# Patient Record
Sex: Female | Born: 1955 | Hispanic: No | Marital: Single | Smoking: Never smoker
Health system: Southern US, Community
[De-identification: ages and names within clinical notes are randomized; demographics above are authoritative.]

## PROBLEM LIST (undated history)

## (undated) DIAGNOSIS — R011 Cardiac murmur, unspecified: Secondary | ICD-10-CM

## (undated) DIAGNOSIS — R002 Palpitations: Secondary | ICD-10-CM

## (undated) DIAGNOSIS — I10 Essential (primary) hypertension: Secondary | ICD-10-CM

## (undated) DIAGNOSIS — J45909 Unspecified asthma, uncomplicated: Secondary | ICD-10-CM

## (undated) DIAGNOSIS — E7800 Pure hypercholesterolemia, unspecified: Secondary | ICD-10-CM

## (undated) DIAGNOSIS — D219 Benign neoplasm of connective and other soft tissue, unspecified: Secondary | ICD-10-CM

## (undated) DIAGNOSIS — G5600 Carpal tunnel syndrome, unspecified upper limb: Secondary | ICD-10-CM

## (undated) HISTORY — PX: TUBAL LIGATION: SHX77

---

## 1998-01-05 ENCOUNTER — Emergency Department (HOSPITAL_COMMUNITY): Admission: EM | Admit: 1998-01-05 | Discharge: 1998-01-05 | Payer: Self-pay | Admitting: Emergency Medicine

## 1998-01-18 ENCOUNTER — Ambulatory Visit (HOSPITAL_COMMUNITY): Admission: RE | Admit: 1998-01-18 | Discharge: 1998-01-18 | Payer: Self-pay | Admitting: Gastroenterology

## 1998-03-11 ENCOUNTER — Emergency Department (HOSPITAL_COMMUNITY): Admission: EM | Admit: 1998-03-11 | Discharge: 1998-03-11 | Payer: Self-pay | Admitting: Emergency Medicine

## 1998-05-23 ENCOUNTER — Emergency Department (HOSPITAL_COMMUNITY): Admission: EM | Admit: 1998-05-23 | Discharge: 1998-05-23 | Payer: Self-pay | Admitting: Emergency Medicine

## 1998-09-05 ENCOUNTER — Emergency Department (HOSPITAL_COMMUNITY): Admission: EM | Admit: 1998-09-05 | Discharge: 1998-09-05 | Payer: Self-pay | Admitting: Emergency Medicine

## 1998-09-06 ENCOUNTER — Emergency Department (HOSPITAL_COMMUNITY): Admission: EM | Admit: 1998-09-06 | Discharge: 1998-09-06 | Payer: Self-pay | Admitting: Emergency Medicine

## 1999-03-20 ENCOUNTER — Emergency Department (HOSPITAL_COMMUNITY): Admission: EM | Admit: 1999-03-20 | Discharge: 1999-03-20 | Payer: Self-pay | Admitting: Internal Medicine

## 2000-05-23 ENCOUNTER — Emergency Department (HOSPITAL_COMMUNITY): Admission: EM | Admit: 2000-05-23 | Discharge: 2000-05-23 | Payer: Self-pay | Admitting: *Deleted

## 2001-01-11 ENCOUNTER — Emergency Department (HOSPITAL_COMMUNITY): Admission: EM | Admit: 2001-01-11 | Discharge: 2001-01-12 | Payer: Self-pay | Admitting: Emergency Medicine

## 2001-01-12 ENCOUNTER — Emergency Department (HOSPITAL_COMMUNITY): Admission: EM | Admit: 2001-01-12 | Discharge: 2001-01-12 | Payer: Self-pay | Admitting: Emergency Medicine

## 2001-01-12 ENCOUNTER — Encounter: Payer: Self-pay | Admitting: Emergency Medicine

## 2001-01-14 ENCOUNTER — Encounter: Payer: Self-pay | Admitting: Emergency Medicine

## 2001-01-14 ENCOUNTER — Emergency Department (HOSPITAL_COMMUNITY): Admission: EM | Admit: 2001-01-14 | Discharge: 2001-01-14 | Payer: Self-pay | Admitting: Emergency Medicine

## 2001-02-02 ENCOUNTER — Encounter: Admission: RE | Admit: 2001-02-02 | Discharge: 2001-02-02 | Payer: Self-pay | Admitting: Urology

## 2001-02-02 ENCOUNTER — Encounter: Payer: Self-pay | Admitting: Urology

## 2001-02-05 ENCOUNTER — Encounter: Admission: RE | Admit: 2001-02-05 | Discharge: 2001-02-05 | Payer: Self-pay | Admitting: Urology

## 2001-02-05 ENCOUNTER — Encounter: Payer: Self-pay | Admitting: Urology

## 2001-05-06 ENCOUNTER — Emergency Department (HOSPITAL_COMMUNITY): Admission: EM | Admit: 2001-05-06 | Discharge: 2001-05-06 | Payer: Self-pay | Admitting: Emergency Medicine

## 2001-05-06 ENCOUNTER — Encounter: Payer: Self-pay | Admitting: Emergency Medicine

## 2001-05-18 ENCOUNTER — Encounter: Payer: Self-pay | Admitting: Cardiology

## 2001-05-18 ENCOUNTER — Encounter: Admission: RE | Admit: 2001-05-18 | Discharge: 2001-05-18 | Payer: Self-pay | Admitting: Cardiology

## 2001-07-27 ENCOUNTER — Ambulatory Visit (HOSPITAL_COMMUNITY): Admission: RE | Admit: 2001-07-27 | Discharge: 2001-07-27 | Payer: Self-pay | Admitting: Cardiology

## 2001-09-04 ENCOUNTER — Emergency Department (HOSPITAL_COMMUNITY): Admission: EM | Admit: 2001-09-04 | Discharge: 2001-09-04 | Payer: Self-pay | Admitting: Emergency Medicine

## 2001-09-04 ENCOUNTER — Encounter: Payer: Self-pay | Admitting: Emergency Medicine

## 2001-09-11 ENCOUNTER — Emergency Department (HOSPITAL_COMMUNITY): Admission: EM | Admit: 2001-09-11 | Discharge: 2001-09-11 | Payer: Self-pay | Admitting: Emergency Medicine

## 2002-01-08 ENCOUNTER — Emergency Department (HOSPITAL_COMMUNITY): Admission: EM | Admit: 2002-01-08 | Discharge: 2002-01-08 | Payer: Self-pay | Admitting: Emergency Medicine

## 2002-01-21 ENCOUNTER — Emergency Department (HOSPITAL_COMMUNITY): Admission: EM | Admit: 2002-01-21 | Discharge: 2002-01-21 | Payer: Self-pay | Admitting: Emergency Medicine

## 2002-05-08 ENCOUNTER — Emergency Department (HOSPITAL_COMMUNITY): Admission: EM | Admit: 2002-05-08 | Discharge: 2002-05-08 | Payer: Self-pay | Admitting: Emergency Medicine

## 2002-05-31 ENCOUNTER — Encounter: Payer: Self-pay | Admitting: Emergency Medicine

## 2002-05-31 ENCOUNTER — Emergency Department (HOSPITAL_COMMUNITY): Admission: EM | Admit: 2002-05-31 | Discharge: 2002-05-31 | Payer: Self-pay | Admitting: Emergency Medicine

## 2003-06-20 ENCOUNTER — Emergency Department (HOSPITAL_COMMUNITY): Admission: AD | Admit: 2003-06-20 | Discharge: 2003-06-20 | Payer: Self-pay | Admitting: Family Medicine

## 2003-11-06 ENCOUNTER — Emergency Department (HOSPITAL_COMMUNITY): Admission: EM | Admit: 2003-11-06 | Discharge: 2003-11-06 | Payer: Self-pay

## 2004-05-07 ENCOUNTER — Emergency Department (HOSPITAL_COMMUNITY): Admission: EM | Admit: 2004-05-07 | Discharge: 2004-05-07 | Payer: Self-pay | Admitting: Family Medicine

## 2004-05-13 ENCOUNTER — Ambulatory Visit (HOSPITAL_COMMUNITY): Admission: RE | Admit: 2004-05-13 | Discharge: 2004-05-13 | Payer: Self-pay | Admitting: Family Medicine

## 2004-06-03 ENCOUNTER — Emergency Department (HOSPITAL_COMMUNITY): Admission: EM | Admit: 2004-06-03 | Discharge: 2004-06-03 | Payer: Self-pay | Admitting: Family Medicine

## 2004-06-13 ENCOUNTER — Emergency Department (HOSPITAL_COMMUNITY): Admission: EM | Admit: 2004-06-13 | Discharge: 2004-06-13 | Payer: Self-pay | Admitting: Family Medicine

## 2004-08-31 ENCOUNTER — Emergency Department (HOSPITAL_COMMUNITY): Admission: EM | Admit: 2004-08-31 | Discharge: 2004-08-31 | Payer: Self-pay | Admitting: Emergency Medicine

## 2005-04-03 ENCOUNTER — Emergency Department (HOSPITAL_COMMUNITY): Admission: EM | Admit: 2005-04-03 | Discharge: 2005-04-03 | Payer: Self-pay | Admitting: Emergency Medicine

## 2006-04-30 ENCOUNTER — Emergency Department (HOSPITAL_COMMUNITY): Admission: EM | Admit: 2006-04-30 | Discharge: 2006-04-30 | Payer: Self-pay | Admitting: Emergency Medicine

## 2006-11-25 ENCOUNTER — Emergency Department (HOSPITAL_COMMUNITY): Admission: EM | Admit: 2006-11-25 | Discharge: 2006-11-25 | Payer: Self-pay | Admitting: Emergency Medicine

## 2007-07-29 ENCOUNTER — Emergency Department (HOSPITAL_COMMUNITY): Admission: EM | Admit: 2007-07-29 | Discharge: 2007-07-29 | Payer: Self-pay | Admitting: Emergency Medicine

## 2007-09-02 ENCOUNTER — Emergency Department (HOSPITAL_COMMUNITY): Admission: EM | Admit: 2007-09-02 | Discharge: 2007-09-02 | Payer: Self-pay | Admitting: Emergency Medicine

## 2008-02-25 ENCOUNTER — Emergency Department (HOSPITAL_COMMUNITY): Admission: EM | Admit: 2008-02-25 | Discharge: 2008-02-25 | Payer: Self-pay | Admitting: Emergency Medicine

## 2008-04-20 ENCOUNTER — Emergency Department (HOSPITAL_COMMUNITY): Admission: EM | Admit: 2008-04-20 | Discharge: 2008-04-20 | Payer: Self-pay | Admitting: Emergency Medicine

## 2008-12-07 ENCOUNTER — Emergency Department (HOSPITAL_COMMUNITY): Admission: EM | Admit: 2008-12-07 | Discharge: 2008-12-07 | Payer: Self-pay | Admitting: Emergency Medicine

## 2009-09-29 ENCOUNTER — Emergency Department (HOSPITAL_COMMUNITY): Admission: EM | Admit: 2009-09-29 | Discharge: 2009-09-29 | Payer: Self-pay | Admitting: Emergency Medicine

## 2010-08-03 ENCOUNTER — Encounter: Payer: Self-pay | Admitting: Family Medicine

## 2010-08-04 ENCOUNTER — Encounter: Payer: Self-pay | Admitting: Emergency Medicine

## 2010-10-22 LAB — POCT CARDIAC MARKERS
CKMB, poc: <1 ng/mL — ABNORMAL LOW (ref 1.0–8.0)
Myoglobin, poc: 90.9 ng/mL (ref 12–200)

## 2010-10-22 LAB — POCT I-STAT, CHEM 8
HCT: 35 % — ABNORMAL LOW (ref 36.0–46.0)
Hemoglobin: 11.9 g/dL — ABNORMAL LOW (ref 12.0–15.0)
Potassium: 4 mEq/L (ref 3.5–5.1)
Sodium: 140 mEq/L (ref 135–145)

## 2010-12-18 ENCOUNTER — Emergency Department (HOSPITAL_COMMUNITY)
Admission: EM | Admit: 2010-12-18 | Discharge: 2010-12-18 | Disposition: A | Discharge status: Home / Self Care | Payer: Self-pay | Attending: Emergency Medicine | Admitting: Emergency Medicine

## 2010-12-18 DIAGNOSIS — M25539 Pain in unspecified wrist: Secondary | ICD-10-CM | POA: Insufficient documentation

## 2010-12-18 DIAGNOSIS — M65839 Other synovitis and tenosynovitis, unspecified forearm: Secondary | ICD-10-CM | POA: Insufficient documentation

## 2010-12-18 DIAGNOSIS — M65849 Other synovitis and tenosynovitis, unspecified hand: Secondary | ICD-10-CM | POA: Insufficient documentation

## 2010-12-18 DIAGNOSIS — M79609 Pain in unspecified limb: Secondary | ICD-10-CM

## 2010-12-18 LAB — URINE MICROSCOPIC-ADD ON

## 2010-12-18 LAB — POCT I-STAT, CHEM 8
Calcium, Ion: 1.2 mmol/L (ref 1.12–1.32)
Glucose, Bld: 103 mg/dL — ABNORMAL HIGH (ref 70–99)
HCT: 32 % — ABNORMAL LOW (ref 36.0–46.0)
Hemoglobin: 10.9 g/dL — ABNORMAL LOW (ref 12.0–15.0)

## 2010-12-18 LAB — URINALYSIS, ROUTINE W REFLEX MICROSCOPIC
Bilirubin Urine: NEGATIVE
Ketones, ur: NEGATIVE mg/dL
Nitrite: NEGATIVE
Urobilinogen, UA: 0.2 mg/dL (ref 0.0–1.0)
pH: 5.5 (ref 5.0–8.0)

## 2011-04-04 ENCOUNTER — Emergency Department (HOSPITAL_COMMUNITY)
Admission: EM | Admit: 2011-04-04 | Discharge: 2011-04-04 | Disposition: A | Discharge status: Home / Self Care | Payer: Self-pay | Attending: Emergency Medicine | Admitting: Emergency Medicine

## 2011-04-04 DIAGNOSIS — M79609 Pain in unspecified limb: Secondary | ICD-10-CM | POA: Insufficient documentation

## 2011-04-04 DIAGNOSIS — M779 Enthesopathy, unspecified: Secondary | ICD-10-CM | POA: Insufficient documentation

## 2011-04-04 DIAGNOSIS — M7989 Other specified soft tissue disorders: Secondary | ICD-10-CM | POA: Insufficient documentation

## 2011-04-04 DIAGNOSIS — M62838 Other muscle spasm: Secondary | ICD-10-CM | POA: Insufficient documentation

## 2011-04-04 DIAGNOSIS — M25519 Pain in unspecified shoulder: Secondary | ICD-10-CM | POA: Insufficient documentation

## 2011-04-04 LAB — BASIC METABOLIC PANEL WITH GFR
BUN: 11
CO2: 27
Calcium: 9.5
Chloride: 109
Creatinine, Ser: 0.97
GFR calc Af Amer: >60

## 2011-04-04 LAB — DIFFERENTIAL
Basophils Absolute: 0.1
Basophils Relative: 1
Eosinophils Absolute: 0.1
Eosinophils Relative: 1
Lymphocytes Relative: 17
Lymphs Abs: 1.3
Monocytes Absolute: 0.3
Monocytes Relative: 4
Neutro Abs: 6
Neutrophils Relative %: 77

## 2011-04-04 LAB — POCT CARDIAC MARKERS
CKMB, poc: <1 — ABNORMAL LOW
Myoglobin, poc: 52.6
Operator id: 4708
Troponin i, poc: <0.05

## 2011-04-04 LAB — CBC
HCT: 29.9 — ABNORMAL LOW
Hemoglobin: 9.5 — ABNORMAL LOW
MCHC: 31.8
MCV: 76 — ABNORMAL LOW
Platelets: 385
RBC: 3.94
RDW: 21.2 — ABNORMAL HIGH
WBC: 7.8

## 2011-04-04 LAB — BASIC METABOLIC PANEL
GFR calc non Af Amer: >60
Glucose, Bld: 102 — ABNORMAL HIGH
Potassium: 4.2
Sodium: 141

## 2011-04-17 ENCOUNTER — Emergency Department (HOSPITAL_COMMUNITY)
Admission: EM | Admit: 2011-04-17 | Discharge: 2011-04-17 | Disposition: A | Discharge status: Home / Self Care | Payer: Self-pay | Attending: Emergency Medicine | Admitting: Emergency Medicine

## 2011-04-17 ENCOUNTER — Emergency Department (HOSPITAL_COMMUNITY): Payer: Self-pay

## 2011-04-17 DIAGNOSIS — R110 Nausea: Secondary | ICD-10-CM | POA: Insufficient documentation

## 2011-04-17 DIAGNOSIS — M25519 Pain in unspecified shoulder: Secondary | ICD-10-CM | POA: Insufficient documentation

## 2011-04-17 DIAGNOSIS — M546 Pain in thoracic spine: Secondary | ICD-10-CM | POA: Insufficient documentation

## 2011-04-17 DIAGNOSIS — R209 Unspecified disturbances of skin sensation: Secondary | ICD-10-CM | POA: Insufficient documentation

## 2011-04-17 DIAGNOSIS — R42 Dizziness and giddiness: Secondary | ICD-10-CM | POA: Insufficient documentation

## 2011-04-17 DIAGNOSIS — I498 Other specified cardiac arrhythmias: Secondary | ICD-10-CM | POA: Insufficient documentation

## 2011-04-17 DIAGNOSIS — R072 Precordial pain: Secondary | ICD-10-CM | POA: Insufficient documentation

## 2011-04-17 DIAGNOSIS — R011 Cardiac murmur, unspecified: Secondary | ICD-10-CM | POA: Insufficient documentation

## 2011-04-17 LAB — BASIC METABOLIC PANEL
BUN: 15 mg/dL (ref 6–23)
Calcium: 9.7 mg/dL (ref 8.4–10.5)
GFR calc Af Amer: 80 mL/min — ABNORMAL LOW (ref >90)
GFR calc non Af Amer: 69 mL/min — ABNORMAL LOW (ref >90)
Potassium: 4.4 mEq/L (ref 3.5–5.1)
Sodium: 136 mEq/L (ref 135–145)

## 2011-04-17 LAB — DIFFERENTIAL
Basophils Relative: 0 % (ref 0–1)
Eosinophils Absolute: 0 10*3/uL (ref 0.0–0.7)
Eosinophils Relative: 0 % (ref 0–5)
Neutrophils Relative %: 87 % — ABNORMAL HIGH (ref 43–77)

## 2011-04-17 LAB — CBC
Platelets: 209 10*3/uL (ref 150–400)
RBC: 4.81 MIL/uL (ref 3.87–5.11)
RDW: 17.4 % — ABNORMAL HIGH (ref 11.5–15.5)
WBC: 11.5 10*3/uL — ABNORMAL HIGH (ref 4.0–10.5)

## 2011-04-17 LAB — POCT I-STAT TROPONIN I: Troponin i, poc: 0 ng/mL (ref 0.00–0.08)

## 2012-01-29 ENCOUNTER — Emergency Department (HOSPITAL_COMMUNITY)
Admission: EM | Admit: 2012-01-29 | Discharge: 2012-01-29 | Disposition: A | Discharge status: Home / Self Care | Payer: Self-pay | Attending: Emergency Medicine | Admitting: Emergency Medicine

## 2012-01-29 ENCOUNTER — Encounter (HOSPITAL_COMMUNITY): Payer: Self-pay | Admitting: Emergency Medicine

## 2012-01-29 DIAGNOSIS — X58XXXA Exposure to other specified factors, initial encounter: Secondary | ICD-10-CM | POA: Insufficient documentation

## 2012-01-29 DIAGNOSIS — S025XXA Fracture of tooth (traumatic), initial encounter for closed fracture: Secondary | ICD-10-CM | POA: Insufficient documentation

## 2012-01-29 DIAGNOSIS — K0889 Other specified disorders of teeth and supporting structures: Secondary | ICD-10-CM

## 2012-01-29 HISTORY — DX: Palpitations: R00.2

## 2012-01-29 HISTORY — DX: Cardiac murmur, unspecified: R01.1

## 2012-01-29 MED ORDER — HYDROCODONE-ACETAMINOPHEN 5-325 MG PO TABS
1.0000 | ORAL_TABLET | Freq: Once | ORAL | Status: AC
  Administered 2012-01-29: 1 via ORAL
  Filled 2012-01-29: qty 1

## 2012-01-29 MED ORDER — HYDROCODONE-ACETAMINOPHEN 5-325 MG PO TABS: ORAL_TABLET | ORAL | Status: DC

## 2012-01-29 MED ORDER — IBUPROFEN 800 MG PO TABS: 800.0000 mg | ORAL_TABLET | Freq: Three times a day (TID) | ORAL | Status: DC | PRN

## 2012-01-29 NOTE — ED Provider Notes (Signed)
History     CSN: [id number]  Arrival date & time 01/29/12  1128   First MD Initiated Contact with Patient 01/29/12 1142      Chief Complaint  Patient presents with  . Dental Pain    (Consider location/radiation/quality/duration/timing/severity/associated sxs/prior treatment) HPI Comments: Patient presents with 3 days of dental pain on her left lower jaw. Patient states this began acutely after breaking her tooth on a kernel of popcorn. Patient has used ibuprofen for pain without relief and states this is making her stomach upset. She denies facial or neck swelling. She denies trouble breathing or fever. Patient denies head injury. Nothing makes symptoms better or worse. Onset was acute. Course is constant. Pain radiates to the patient's left ear.  Patient is a 56 y.o. female presenting with tooth pain. The history is provided by the patient.  Dental PainPrimary symptoms do not include mouth pain, headaches, fever, shortness of breath or sore throat. The symptoms began 3 to 5 days ago. The symptoms are unchanged. The symptoms are new.  Additional symptoms include: gum tenderness. Additional symptoms do not include: gum swelling, facial swelling, trouble swallowing and ear pain.    Past Medical History  Diagnosis Date  . Heart palpitations   . Heart murmur     Past Surgical History  Procedure Date  . Tubal ligation     No family history on file.  History  Substance Use Topics  . Smoking status: Never Smoker   . Smokeless tobacco: Not on file  . Alcohol Use: No    OB History    Grav Para Term Preterm Abortions TAB SAB Ect Mult Living                  Review of Systems  Constitutional: Negative for fever.  HENT: Positive for dental problem. Negative for ear pain, sore throat, facial swelling, trouble swallowing and neck pain.   Respiratory: Negative for shortness of breath and stridor.   Skin: Negative for color change.  Neurological: Negative for headaches.     Allergies  Codeine  Home Medications  No current outpatient prescriptions on file.  BP 139/88  Pulse 96  Temp 97.8 F (36.6 C) (Oral)  Resp 16  SpO2 99%  Physical Exam  Nursing note and vitals reviewed. Constitutional: She is oriented to person, place, and time. She appears well-developed and well-nourished.  HENT:  Head: Normocephalic and atraumatic. No trismus in the jaw.  Right Ear: Tympanic membrane, external ear and ear canal normal.  Left Ear: Tympanic membrane, external ear and ear canal normal.  Nose: Nose normal.  Mouth/Throat: Uvula is midline, oropharynx is clear and moist and mucous membranes are normal. Abnormal dentition. Dental caries present. No dental abscesses or uvula swelling. No tonsillar abscesses.         No swelling or erythema noted on exam.  Eyes: Conjunctivae are normal.  Neck: Normal range of motion. Neck supple.       No neck swelling or Ludwig's angina  Lymphadenopathy:    She has no cervical adenopathy.  Neurological: She is alert and oriented to person, place, and time.  Skin: Skin is warm and dry.  Psychiatric: She has a normal mood and affect.    ED Course  Procedures (including critical care time)  Labs Reviewed - No data to display No results found.   1. Pain, dental     12:04 PM Patient seen and examined. Medications ordered.   Vital signs reviewed and are as follows:  Filed Vitals:   01/29/12 1140  BP: 139/88  Pulse: 96  Temp: 97.8 F (36.6 C)  Resp: 16   Patient counseled to take prescribed medications as directed, return with worsening facial or neck swelling, and to follow-up with his dentist as soon as possible.   Patient counseled on use of narcotic pain medications. Counseled not to combine these medications with others containing tylenol. Urged not to drink alcohol, drive, or perform any other activities that requires focus while taking these medications. The patient verbalizes understanding and agrees with  the plan.   MDM  Patient with toothache. No abscess. Exam unconcerning for Ludwig's angina or other deep tissue infection in neck.  Will treat with pain medicine/NSAIDs.  Urged patient to follow-up with dentist.           Renne Crigler, PA 01/29/12 1308

## 2012-01-29 NOTE — ED Provider Notes (Signed)
Medical screening examination/treatment/procedure(s) were performed by non-physician practitioner and as supervising physician I was immediately available for consultation/collaboration.   Lyanne Co, MD 01/29/12 [phone]

## 2012-01-29 NOTE — ED Notes (Signed)
Toothache, started 3 days ago, was taking ibuprofen but "making stomach hurt"

## 2012-01-29 NOTE — ED Notes (Signed)
Pt presenting to ed with c/o lower left side toothache pain x 3 days.

## 2012-02-06 ENCOUNTER — Emergency Department (HOSPITAL_COMMUNITY)
Admission: EM | Admit: 2012-02-06 | Discharge: 2012-02-06 | Disposition: A | Discharge status: Home / Self Care | Payer: Self-pay | Attending: Emergency Medicine | Admitting: Emergency Medicine

## 2012-02-06 ENCOUNTER — Encounter (HOSPITAL_COMMUNITY): Payer: Self-pay | Admitting: *Deleted

## 2012-02-06 DIAGNOSIS — K029 Dental caries, unspecified: Secondary | ICD-10-CM | POA: Insufficient documentation

## 2012-02-06 DIAGNOSIS — H538 Other visual disturbances: Secondary | ICD-10-CM | POA: Insufficient documentation

## 2012-02-06 DIAGNOSIS — S025XXA Fracture of tooth (traumatic), initial encounter for closed fracture: Secondary | ICD-10-CM | POA: Insufficient documentation

## 2012-02-06 DIAGNOSIS — K0889 Other specified disorders of teeth and supporting structures: Secondary | ICD-10-CM

## 2012-02-06 DIAGNOSIS — X58XXXA Exposure to other specified factors, initial encounter: Secondary | ICD-10-CM | POA: Insufficient documentation

## 2012-02-06 MED ORDER — PENICILLIN V POTASSIUM 500 MG PO TABS: 500.0000 mg | ORAL_TABLET | Freq: Three times a day (TID) | ORAL | Status: AC

## 2012-02-06 MED ORDER — HYDROCODONE-ACETAMINOPHEN 5-325 MG PO TABS: ORAL_TABLET | ORAL | Status: AC

## 2012-02-06 MED ORDER — IBUPROFEN 800 MG PO TABS: 800.0000 mg | ORAL_TABLET | Freq: Three times a day (TID) | ORAL | Status: AC | PRN

## 2012-02-06 NOTE — ED Provider Notes (Signed)
History     CSN: [id number]  Arrival date & time 02/06/12  1191   First MD Initiated Contact with Patient 01/29/12 1142      Chief Complaint  Patient presents with  . Dental Pain    (Consider location/radiation/quality/duration/timing/severity/associated sxs/prior treatment) HPI Comments: Patient presents with 1-2 weeks of dental pain on her left lower jaw. Patient states this began acutely at that time after breaking her tooth on a kernel of popcorn. Patient has used ibuprofen for pain without relief. She was seen in ED by myself a week ago and rx hydrocodone. She states she has developed mild right facial swelling. She denies trouble breathing or fever. She states she has some mild blurry vision in L eye without weakness in extremities. She has upcoming opthalmology and PCP appointments. Patient states she has dental appt in 3 days. Patient denies head injury. Nothing makes symptoms better or worse. Onset was acute. Course is constant. Pain radiates to the patient's left ear.  Dental PainPrimary symptoms do not include mouth pain, headaches, fever, shortness of breath or sore throat. The symptoms began more than 1 week ago. The symptoms are unchanged. The symptoms are new.  Additional symptoms include: gum tenderness and facial swelling. Additional symptoms do not include: gum swelling, trouble swallowing and ear pain.   Patient is a 56 y.o. female presenting with tooth pain. The history is provided by the patient.    Past Medical History  Diagnosis Date  . Heart palpitations   . Heart murmur     Past Surgical History  Procedure Date  . Tubal ligation     No family history on file.  History  Substance Use Topics  . Smoking status: Never Smoker   . Smokeless tobacco: Not on file  . Alcohol Use: No    OB History    Grav Para Term Preterm Abortions TAB SAB Ect Mult Living                  Review of Systems  Constitutional: Negative for fever.  HENT: Positive for  facial swelling and dental problem. Negative for ear pain, sore throat, trouble swallowing and neck pain.   Eyes: Positive for visual disturbance.  Respiratory: Negative for shortness of breath and stridor.   Skin: Negative for color change.  Neurological: Negative for weakness and headaches.    Allergies  Codeine  Home Medications   Current Outpatient Rx  Name Route Sig Dispense Refill  . HYDROCODONE-ACETAMINOPHEN 5-325 MG PO TABS  Take 1-2 tablets every 6 hours as needed for severe pain 10 tablet 0  . IBUPROFEN 800 MG PO TABS Oral Take 1 tablet (800 mg total) by mouth every 8 (eight) hours as needed for pain. 15 tablet 0  . PENICILLIN V POTASSIUM 500 MG PO TABS Oral Take 1 tablet (500 mg total) by mouth 3 (three) times daily. 21 tablet 0    BP 157/89  Pulse 58  Temp 98.4 F (36.9 C) (Oral)  Resp 17  Wt 157 lb (71.215 kg)  SpO2 99%  Physical Exam  Nursing note and vitals reviewed. Constitutional: She appears well-developed and well-nourished.  HENT:  Head: Normocephalic and atraumatic. No trismus in the jaw.  Right Ear: Tympanic membrane, external ear and ear canal normal.  Left Ear: Tympanic membrane, external ear and ear canal normal.  Nose: Nose normal.  Mouth/Throat: Uvula is midline, oropharynx is clear and moist and mucous membranes are normal. Abnormal dentition. Dental caries present. No dental abscesses or  uvula swelling. No tonsillar abscesses.         No swelling or erythema noted on exam.  Eyes: Conjunctivae are normal.  Neck: Normal range of motion. Neck supple.       No neck swelling or Ludwig's angina  Lymphadenopathy:    She has no cervical adenopathy.  Neurological: She is alert. She has normal strength.  Skin: Skin is warm and dry.  Psychiatric: She has a normal mood and affect.    ED Course  Procedures  (including critical care time)  Labs Reviewed - No data to display No results found.   1. Pain, dental     9:47 AM Patient seen and  examined. Medications ordered.   Vital signs reviewed and are as follows: Filed Vitals:   02/06/12 0922  BP: 157/89  Pulse: 58  Temp: 98.4 F (36.9 C)  Resp: 17   Patient counseled to take prescribed medications as directed, return with worsening facial or neck swelling, and to follow-up with his dentist as soon as possible.   Patient counseled on use of narcotic pain medications. Counseled not to combine these medications with others containing tylenol. Urged not to drink alcohol, drive, or perform any other activities that requires focus while taking these medications. The patient verbalizes understanding and agrees with the plan.   MDM  Patient with toothache. No abscess. Exam unconcerning for Ludwig's angina or other deep tissue infection in neck.  Will treat with pain medicine/NSAIDs/penicillin.  Urged patient to follow-up with dentist.    Unclear why patient is reporting blurry vision in L eye. It is mild. She does not have any other neurological findings, CN II-XII intact, strength normal. She has follow-up planned with PCP and ophtho.       [place], [place] 02/06/12 [phone]

## 2012-02-06 NOTE — ED Notes (Signed)
Pt states "I tried to get an appt today but as you know most dentists are closed on Friday, my dentist can see me on Monday, it's a cracked wisdom tooth"; pt c/o left lower back tooth pain.

## 2012-02-07 NOTE — ED Provider Notes (Signed)
Medical screening examination/treatment/procedure(s) were performed by non-physician practitioner and as supervising physician I was immediately available for consultation/collaboration.  Lonni Dirden, MD 02/07/12 1113 

## 2012-03-22 ENCOUNTER — Emergency Department (HOSPITAL_COMMUNITY)
Admission: EM | Admit: 2012-03-22 | Discharge: 2012-03-22 | Disposition: A | Discharge status: Home / Self Care | Payer: Self-pay | Attending: Emergency Medicine | Admitting: Emergency Medicine

## 2012-03-22 ENCOUNTER — Encounter (HOSPITAL_COMMUNITY): Payer: Self-pay | Admitting: Emergency Medicine

## 2012-03-22 DIAGNOSIS — Z8249 Family history of ischemic heart disease and other diseases of the circulatory system: Secondary | ICD-10-CM | POA: Insufficient documentation

## 2012-03-22 DIAGNOSIS — Z885 Allergy status to narcotic agent status: Secondary | ICD-10-CM | POA: Insufficient documentation

## 2012-03-22 DIAGNOSIS — K0889 Other specified disorders of teeth and supporting structures: Secondary | ICD-10-CM

## 2012-03-22 DIAGNOSIS — Z833 Family history of diabetes mellitus: Secondary | ICD-10-CM | POA: Insufficient documentation

## 2012-03-22 DIAGNOSIS — R079 Chest pain, unspecified: Secondary | ICD-10-CM | POA: Insufficient documentation

## 2012-03-22 DIAGNOSIS — K089 Disorder of teeth and supporting structures, unspecified: Secondary | ICD-10-CM | POA: Insufficient documentation

## 2012-03-22 MED ORDER — KETOROLAC TROMETHAMINE 10 MG PO TABS: 10.0000 mg | ORAL_TABLET | Freq: Four times a day (QID) | ORAL | Status: AC | PRN

## 2012-03-22 MED ORDER — AMOXICILLIN-POT CLAVULANATE 875-125 MG PO TABS: 1.0000 | ORAL_TABLET | Freq: Two times a day (BID) | ORAL | Status: AC

## 2012-03-22 NOTE — ED Notes (Signed)
Pt reports hx of angina, chest pain and shortness of breath. No cardiac symptoms or chest pain today. Current c/o dental pain. Dentist can not see her until Friday

## 2012-03-22 NOTE — ED Provider Notes (Signed)
Date: 03/22/2012  Rate: 69  Rhythm: normal sinus rhythm  QRS Axis: normal  Intervals: normal  ST/T Wave abnormalities: normal  Conduction Disutrbances:none  Narrative Interpretation:   Old EKG Reviewed: changes noted from 20/10/2010 HR was 116  Medical screening examination/treatment/procedure(s) were performed by non-physician practitioner and as supervising physician I was immediately available for consultation/collaboration.   Devoria Albe, MD, Armando Gang     Ward Givens, MD 03/22/12 [phone]

## 2012-03-22 NOTE — ED Provider Notes (Signed)
History     CSN: [id number]  Arrival date & time 03/22/12  4098   First MD Initiated Contact with Patient 03/22/12 1013      Chief Complaint  Patient presents with  . Chest Pain    2 day hx of chest pain. Angina x 1 week. Denies chest pain, denies shortness of breath at present  . Jaw Pain    r/jaw upper and lower jaw x 3 days    (Consider location/radiation/quality/duration/timing/severity/associated sxs/prior treatment) HPI Comments: Patient presents with jaw pain for the past 3 days. The jaw pain is located in the right upper and lower jaw, does not radiate, and is described at throbbing and severe. She has taken ibuprofen for the pain which makes her sick and provides minimal pain relief. She also reports a history of chest pain and shortness of breath that is not currently present. She denies headache, visual changes, difficulty swallowing or breathing.   Patient is a 56 y.o. female presenting with chest pain.  Chest Pain     Past Medical History  Diagnosis Date  . Heart palpitations   . Heart murmur     Past Surgical History  Procedure Date  . Tubal ligation     Family History  Problem Relation Age of Onset  . Diabetes Mother   . Hypertension Mother     History  Substance Use Topics  . Smoking status: Never Smoker   . Smokeless tobacco: Not on file  . Alcohol Use: No    OB History    Grav Para Term Preterm Abortions TAB SAB Ect Mult Living                  Review of Systems  HENT: Positive for dental problem.   Cardiovascular: Positive for chest pain.  All other systems reviewed and are negative.    Allergies  Codeine  Home Medications   Current Outpatient Rx  Name Route Sig Dispense Refill  . IBUPROFEN 200 MG PO TABS Oral Take 200 mg by mouth every 6 (six) hours as needed.      BP 134/87  Pulse 76  Temp 97.9 F (36.6 C) (Oral)  SpO2 97%  LMP 03/21/2012  Physical Exam  Nursing note and vitals reviewed. Constitutional: She is  oriented to person, place, and time. She appears well-developed and well-nourished. No distress.  HENT:  Head: Normocephalic and atraumatic.  Mouth/Throat: Oropharynx is clear and moist. No oropharyngeal exudate.         Poor dentition. Multiple rotted teeth (noted in black) and broken teeth (noted in red). No ulceration or sores noted. Upper and lower jaw teeth tender to percussion.   Eyes: Conjunctivae are normal. No scleral icterus.  Neck: Normal range of motion. Neck supple.  Cardiovascular: Normal rate and regular rhythm.  Exam reveals no gallop and no friction rub.   No murmur heard. Pulmonary/Chest: Effort normal and breath sounds normal. No respiratory distress. She has no wheezes. She has no rales. She exhibits no tenderness.  Musculoskeletal: Normal range of motion.  Lymphadenopathy:    She has no cervical adenopathy.  Neurological: She is alert and oriented to person, place, and time.  Skin: Skin is warm and dry. She is not diaphoretic.  Psychiatric: She has a normal mood and affect. Her behavior is normal.    ED Course  Procedures (including critical care time)  Labs Reviewed - No data to display No results found.   No diagnosis found.    MDM  10:45 AM Patient currently has dental pain. She has a dentist appointment on Friday. I will give her pain medication to last until Friday. I recommended a follow up with her cardiologist for chest pain and shortness of breath. She is not currently experiencing cardiac symptoms. Her EKG shows no abnormalities. No further workup is warranted at this time.         Emilia Beck, PA-C 03/22/12 1641

## 2012-03-23 NOTE — ED Provider Notes (Signed)
Medical screening examination/treatment/procedure(s) were performed by non-physician practitioner and as supervising physician I was immediately available for consultation/collaboration. Devoria Albe, MD, FACEP   Ward Givens, MD 03/23/12 [phone]

## 2013-01-28 ENCOUNTER — Encounter (HOSPITAL_COMMUNITY): Payer: Self-pay | Admitting: *Deleted

## 2013-01-28 ENCOUNTER — Emergency Department (HOSPITAL_COMMUNITY): Payer: BC Managed Care – PPO

## 2013-01-28 ENCOUNTER — Emergency Department (HOSPITAL_COMMUNITY)
Admission: EM | Admit: 2013-01-28 | Discharge: 2013-01-28 | Disposition: A | Discharge status: Home / Self Care | Payer: BC Managed Care – PPO | Attending: Emergency Medicine | Admitting: Emergency Medicine

## 2013-01-28 DIAGNOSIS — R1110 Vomiting, unspecified: Secondary | ICD-10-CM | POA: Insufficient documentation

## 2013-01-28 DIAGNOSIS — T465X5A Adverse effect of other antihypertensive drugs, initial encounter: Secondary | ICD-10-CM | POA: Insufficient documentation

## 2013-01-28 DIAGNOSIS — R509 Fever, unspecified: Secondary | ICD-10-CM | POA: Insufficient documentation

## 2013-01-28 DIAGNOSIS — Z79899 Other long term (current) drug therapy: Secondary | ICD-10-CM | POA: Insufficient documentation

## 2013-01-28 DIAGNOSIS — J3489 Other specified disorders of nose and nasal sinuses: Secondary | ICD-10-CM | POA: Insufficient documentation

## 2013-01-28 DIAGNOSIS — R011 Cardiac murmur, unspecified: Secondary | ICD-10-CM | POA: Insufficient documentation

## 2013-01-28 DIAGNOSIS — I10 Essential (primary) hypertension: Secondary | ICD-10-CM | POA: Insufficient documentation

## 2013-01-28 DIAGNOSIS — R059 Cough, unspecified: Secondary | ICD-10-CM | POA: Insufficient documentation

## 2013-01-28 DIAGNOSIS — J392 Other diseases of pharynx: Secondary | ICD-10-CM | POA: Insufficient documentation

## 2013-01-28 DIAGNOSIS — R5381 Other malaise: Secondary | ICD-10-CM | POA: Insufficient documentation

## 2013-01-28 DIAGNOSIS — J029 Acute pharyngitis, unspecified: Secondary | ICD-10-CM | POA: Insufficient documentation

## 2013-01-28 DIAGNOSIS — G44209 Tension-type headache, unspecified, not intractable: Secondary | ICD-10-CM | POA: Insufficient documentation

## 2013-01-28 DIAGNOSIS — R05 Cough: Secondary | ICD-10-CM

## 2013-01-28 HISTORY — DX: Essential (primary) hypertension: I10

## 2013-01-28 MED ORDER — IBUPROFEN 200 MG PO TABS
600.0000 mg | ORAL_TABLET | Freq: Once | ORAL | Status: AC
  Administered 2013-01-28: 600 mg via ORAL
  Filled 2013-01-28: qty 3

## 2013-01-28 MED ORDER — IBUPROFEN 600 MG PO TABS: 600.0000 mg | ORAL_TABLET | Freq: Four times a day (QID) | ORAL | Status: DC | PRN

## 2013-01-28 NOTE — ED Provider Notes (Signed)
History    CSN: [id number] Arrival date & time 01/28/13  1148  First MD Initiated Contact with Patient 01/28/13 1158     Chief Complaint  Patient presents with  . Headache  . Cough   (Consider location/radiation/quality/duration/timing/severity/associated sxs/prior Treatment) HPI Pt states she has had a month of coughing after starting Lisinopril for her BP. Stopped taking it 3 days ago and was started on HCTZ. +subjective fever. +post tussive emesis and dry throat. Pt states she has been under a lot of stress lately and has gradual onset band like HA for several days worse when coughing. No photophobia, nausea, visual changes. States she has not been taking any thing at home for pain. Pt has pain when swallowing attributed to persistent coughing but she is able to swallow. Tolerating secretions.  Past Medical History  Diagnosis Date  . Heart palpitations   . Heart murmur   . Hypertension    Past Surgical History  Procedure Laterality Date  . Tubal ligation     Family History  Problem Relation Age of Onset  . Diabetes Mother   . Hypertension Mother    History  Substance Use Topics  . Smoking status: Never Smoker   . Smokeless tobacco: Not on file  . Alcohol Use: No   OB History   Grav Para Term Preterm Abortions TAB SAB Ect Mult Living                 Review of Systems  Constitutional: Positive for fever and fatigue. Negative for chills.  HENT: Positive for congestion, sore throat and rhinorrhea. Negative for neck pain, neck stiffness and sinus pressure.   Eyes: Negative for photophobia and visual disturbance.  Respiratory: Positive for cough. Negative for shortness of breath and wheezing.   Cardiovascular: Negative for chest pain.  Gastrointestinal: Negative for nausea, vomiting and abdominal pain.  Musculoskeletal: Negative for myalgias and back pain.  Skin: Negative for rash and wound.  Neurological: Positive for headaches. Negative for dizziness, syncope,  weakness, light-headedness and numbness.  All other systems reviewed and are negative.    Allergies  Codeine  Home Medications   Current Outpatient Rx  Name  Route  Sig  Dispense  Refill  . cyclobenzaprine (FLEXERIL) 10 MG tablet   Oral   Take 10 mg by mouth at bedtime as needed for muscle spasms.         . hydrochlorothiazide (HYDRODIURIL) 25 MG tablet   Oral   Take 25 mg by mouth daily.         . Multiple Vitamin (MULTIVITAMIN WITH MINERALS) TABS   Oral   Take 1 tablet by mouth daily.         Marland Kitchen omega-3 acid ethyl esters (LOVAZA) 1 G capsule   Oral   Take 1 g by mouth daily.         . vitamin B-12 (CYANOCOBALAMIN) 1000 MCG tablet   Oral   Take 1,000 mcg by mouth daily.         Marland Kitchen ibuprofen (ADVIL,MOTRIN) 600 MG tablet   Oral   Take 1 tablet (600 mg total) by mouth every 6 (six) hours as needed for pain.   30 tablet   0    BP 114/79  Pulse 66  Temp(Src) 97.6 F (36.4 C) (Oral)  Resp 16  SpO2 100%  LMP 12/29/2012 Physical Exam  Nursing note and vitals reviewed. Constitutional: She is oriented to person, place, and time. She appears well-developed and well-nourished. No distress.  Reading a book when walking in room  HENT:  Head: Normocephalic and atraumatic.  Mouth/Throat: Oropharynx is clear and moist. No oropharyngeal exudate.  Mild TTP over bl temporalis muscle, bl occipital muscle and bl cervical paraspinal muscles.   Eyes: EOM are normal. Pupils are equal, round, and reactive to light.  Neck: Normal range of motion. Neck supple.  No meningismus   Cardiovascular: Normal rate and regular rhythm.   Pulmonary/Chest: Effort normal and breath sounds normal. No respiratory distress. She has no wheezes. She has no rales. She exhibits no tenderness.  Abdominal: Soft. Bowel sounds are normal. She exhibits no distension and no mass. There is no tenderness. There is no rebound and no guarding.  Musculoskeletal: Normal range of motion. She exhibits no edema  and no tenderness.  Neurological: She is alert and oriented to person, place, and time.  5/27motor in all ext, sensation intact  Skin: Skin is warm and dry. No rash noted. No erythema.  Psychiatric: She has a normal mood and affect. Her behavior is normal.    ED Course  Procedures (including critical care time) Labs Reviewed - No data to display Dg Chest 2 View  01/28/2013   *RADIOLOGY REPORT*  Clinical Data: Cough, chest pain  CHEST - 2 VIEW  Findings: The lungs are well-aerated and free from pulmonary edema, focal airspace consolidation or pulmonary nodule.  Cardiac and mediastinal contours are within normal limits.  No pneumothorax, or pleural effusion. No acute osseous findings.  IMPRESSION:  No acute cardiopulmonary disease.   Original Report Authenticated By: Malachy Moan, M.D.   1. Cough due to ACE inhibitor   2. Tension headache     MDM  Suspect cough caused by lisinopril. Will get cxr to r/o pneumonia given subjective fever. Pt HA is consistent with tension HA likely due to stress and persistent cough. Will treat with ibuprofen. Pt advised to take lozenge for sore, dry throat Neg CXR. Return precautions given  Loren Racer, MD 01/28/13 1406

## 2013-01-28 NOTE — ED Notes (Signed)
Pt reports taking lisinopril last month, started to develop cough and difficulty swallowing. Reports onset of headache Monday

## 2013-08-04 ENCOUNTER — Emergency Department (HOSPITAL_COMMUNITY)
Admission: EM | Admit: 2013-08-04 | Discharge: 2013-08-04 | Disposition: A | Discharge status: Home / Self Care | Payer: BC Managed Care – PPO | Attending: Emergency Medicine | Admitting: Emergency Medicine

## 2013-08-04 ENCOUNTER — Encounter (HOSPITAL_COMMUNITY): Payer: Self-pay | Admitting: Emergency Medicine

## 2013-08-04 DIAGNOSIS — R5381 Other malaise: Secondary | ICD-10-CM | POA: Insufficient documentation

## 2013-08-04 DIAGNOSIS — R011 Cardiac murmur, unspecified: Secondary | ICD-10-CM | POA: Insufficient documentation

## 2013-08-04 DIAGNOSIS — R5383 Other fatigue: Secondary | ICD-10-CM

## 2013-08-04 DIAGNOSIS — J069 Acute upper respiratory infection, unspecified: Secondary | ICD-10-CM | POA: Insufficient documentation

## 2013-08-04 DIAGNOSIS — R110 Nausea: Secondary | ICD-10-CM | POA: Insufficient documentation

## 2013-08-04 DIAGNOSIS — I10 Essential (primary) hypertension: Secondary | ICD-10-CM | POA: Insufficient documentation

## 2013-08-04 DIAGNOSIS — Z79899 Other long term (current) drug therapy: Secondary | ICD-10-CM | POA: Insufficient documentation

## 2013-08-04 DIAGNOSIS — R51 Headache: Secondary | ICD-10-CM | POA: Insufficient documentation

## 2013-08-04 DIAGNOSIS — IMO0001 Reserved for inherently not codable concepts without codable children: Secondary | ICD-10-CM | POA: Insufficient documentation

## 2013-08-04 LAB — RAPID STREP SCREEN (MED CTR MEBANE ONLY): STREPTOCOCCUS, GROUP A SCREEN (DIRECT): NEGATIVE

## 2013-08-04 MED ORDER — BENZONATATE 100 MG PO CAPS: 100.0000 mg | ORAL_CAPSULE | Freq: Three times a day (TID) | ORAL | Status: DC

## 2013-08-04 MED ORDER — GUAIFENESIN 100 MG/5ML PO LIQD: 100.0000 mg | ORAL | Status: DC | PRN

## 2013-08-04 NOTE — ED Notes (Signed)
Pt started 3 days ago with sore throat , bodyaches, chills. Did not take flu shot. States took [employer] last pm. States takes 2 BP pills so is careful about taking cold meds.

## 2013-08-04 NOTE — ED Provider Notes (Signed)
  Medical screening examination/treatment/procedure(s) were performed by non-physician practitioner and as supervising physician I was immediately available for consultation/collaboration.      Carmin Muskrat, MD 08/04/13 1052

## 2013-08-04 NOTE — ED Provider Notes (Signed)
CSN: [id number]     Arrival date & time 08/04/13  9323 History  This chart was scribed for non-physician practitioner working with Carmin Muskrat, MD by Stacy Gardner, ED scribe. This patient was seen in room TR06C/TR06C and the patient's care was started at 9:49 AM.  None    Chief Complaint  Patient presents with  . URI   (Consider location/radiation/quality/duration/timing/severity/associated sxs/prior Treatment) Patient is a 58 y.o. female presenting with URI. The history is provided by the patient and medical records. No language interpreter was used.  URI Presenting symptoms: congestion, fatigue, rhinorrhea and sore throat   Presenting symptoms: no ear pain and no fever   Associated symptoms: headaches and myalgias    HPI Comments: Brenda Peters is a 58 y.o. female who presents to the Emergency Department complaining of URI symptoms for 3 days with gradual worsening. Pt has the associated symptoms of headache, body aches, congestion, and sore throat. Denies fever. Pt states a coworker was sick recently with similar symptoms, and that she works in [employer], so is concerned about communicability.  She states she has had mild nausea secondary to post nasal drip, but denies vomiting. Pt also complains of mild chest discomfort, but only when she coughs and states it feels nothing like any chest discomfort she has had associated with her cardiac history. She has had loose stools in the past several days, but had a normal BM this morning.  She denies any recent travel and did not receive the flu shot this year.  Past Medical History  Diagnosis Date  . Heart palpitations   . Heart murmur   . Hypertension    Past Surgical History  Procedure Laterality Date  . Tubal ligation     Family History  Problem Relation Age of Onset  . Diabetes Mother   . Hypertension Mother    History  Substance Use Topics  . Smoking status: Never Smoker   . Smokeless tobacco: Not on file  .  Alcohol Use: No   OB History   Grav Para Term Preterm Abortions TAB SAB Ect Mult Living                 Review of Systems  Constitutional: Positive for chills, appetite change and fatigue. Negative for fever and diaphoresis.  HENT: Positive for congestion, postnasal drip, rhinorrhea, sinus pressure and sore throat. Negative for ear pain, hearing loss and trouble swallowing.   Gastrointestinal: Positive for nausea. Negative for vomiting, abdominal pain and diarrhea.  Genitourinary:       Pt states she is post-menopausal  Musculoskeletal: Positive for myalgias.  Neurological: Positive for headaches. Negative for weakness, light-headedness and numbness.  All other systems reviewed and are negative.    Allergies  Codeine  Home Medications   Current Outpatient Rx  Name  Route  Sig  Dispense  Refill  . cyclobenzaprine (FLEXERIL) 10 MG tablet   Oral   Take 10 mg by mouth at bedtime as needed for muscle spasms.         . hydrochlorothiazide (HYDRODIURIL) 25 MG tablet   Oral   Take 25 mg by mouth daily.         Marland Kitchen ibuprofen (ADVIL,MOTRIN) 600 MG tablet   Oral   Take 1 tablet (600 mg total) by mouth every 6 (six) hours as needed for pain.   30 tablet   0   . Multiple Vitamin (MULTIVITAMIN WITH MINERALS) TABS   Oral   Take 1 tablet by mouth  daily.         . omega-3 acid ethyl esters (LOVAZA) 1 G capsule   Oral   Take 1 g by mouth daily.         . vitamin B-12 (CYANOCOBALAMIN) 1000 MCG tablet   Oral   Take 1,000 mcg by mouth daily.          BP 130/87  Pulse 88  Temp(Src) 98.5 F (36.9 C) (Oral)  Resp 18  Wt 160 lb 1 oz (72.604 kg)  SpO2 96%  LMP 12/29/2012 Physical Exam  Nursing note and vitals reviewed. Constitutional: She is oriented to person, place, and time. She appears well-developed and well-nourished. No distress.  HENT:  Head: Normocephalic and atraumatic.  Mouth/Throat: Oropharynx is clear and moist.  Eyes: Conjunctivae are normal. Pupils are  equal, round, and reactive to light. No scleral icterus.  Neck: Neck supple.  Mild cervical lymphadenopathy with tenderness  Cardiovascular: Normal rate, regular rhythm, normal heart sounds and intact distal pulses.   No murmur heard. Pulmonary/Chest: Effort normal and breath sounds normal. No stridor. No respiratory distress. She has no wheezes. She has no rales. She exhibits tenderness.  Pt reports mild tenderness to lower sternum.  States "feels like it's from coughing"  Abdominal: Soft. Bowel sounds are normal. She exhibits no distension and no mass. There is tenderness. There is no guarding.    Pt report mild tenderness to mid lower quadrants.  Musculoskeletal: Normal range of motion.  Lymphadenopathy:    She has cervical adenopathy.  Neurological: She is alert and oriented to person, place, and time.  Skin: Skin is warm and dry. No rash noted.  Psychiatric: She has a normal mood and affect. Her behavior is normal.    ED Course  Procedures (including critical care time) DIAGNOSTIC STUDIES: Oxygen Saturation is 96% on room air, normal by my interpretation.    COORDINATION OF CARE:  9:49 AM URI sxs, no hypoxia concerning for PNA, lungs clear.  Discussed course of care with pt . Pt understands and agrees.  Labs Review Labs Reviewed - No data to display Imaging Review No results found.  EKG Interpretation   None       MDM   1. URI (upper respiratory infection)    BP 130/87  Pulse 88  Temp(Src) 98.5 F (36.9 C) (Oral)  Resp 18  Wt 160 lb 1 oz (72.604 kg)  SpO2 96%  LMP 12/29/2012  I personally performed the services described in this documentation, which was scribed in my presence. The recorded information has been reviewed and is accurate.       Domenic Moras, PA-C 08/04/13 1025

## 2013-08-04 NOTE — Discharge Instructions (Signed)

## 2013-08-04 NOTE — ED Notes (Signed)
Pt woke this am with headache, body aches, congestion and sore throat. No fevers. A&ox4, breathing easily

## 2013-08-06 LAB — CULTURE, GROUP A STREP

## 2013-09-21 DIAGNOSIS — G8929 Other chronic pain: Secondary | ICD-10-CM | POA: Insufficient documentation

## 2013-10-24 ENCOUNTER — Encounter (HOSPITAL_COMMUNITY): Payer: Self-pay | Admitting: Emergency Medicine

## 2013-10-24 ENCOUNTER — Emergency Department (HOSPITAL_COMMUNITY)
Admission: EM | Admit: 2013-10-24 | Discharge: 2013-10-24 | Disposition: A | Discharge status: Home / Self Care | Payer: BC Managed Care – PPO | Attending: Emergency Medicine | Admitting: Emergency Medicine

## 2013-10-24 DIAGNOSIS — M545 Low back pain, unspecified: Secondary | ICD-10-CM

## 2013-10-24 DIAGNOSIS — Z79899 Other long term (current) drug therapy: Secondary | ICD-10-CM | POA: Insufficient documentation

## 2013-10-24 DIAGNOSIS — M5416 Radiculopathy, lumbar region: Secondary | ICD-10-CM

## 2013-10-24 DIAGNOSIS — R209 Unspecified disturbances of skin sensation: Secondary | ICD-10-CM | POA: Insufficient documentation

## 2013-10-24 DIAGNOSIS — IMO0002 Reserved for concepts with insufficient information to code with codable children: Secondary | ICD-10-CM | POA: Insufficient documentation

## 2013-10-24 DIAGNOSIS — R5381 Other malaise: Secondary | ICD-10-CM | POA: Insufficient documentation

## 2013-10-24 DIAGNOSIS — R011 Cardiac murmur, unspecified: Secondary | ICD-10-CM | POA: Insufficient documentation

## 2013-10-24 DIAGNOSIS — I10 Essential (primary) hypertension: Secondary | ICD-10-CM | POA: Insufficient documentation

## 2013-10-24 DIAGNOSIS — R5383 Other fatigue: Secondary | ICD-10-CM

## 2013-10-24 MED ORDER — KETOROLAC TROMETHAMINE 60 MG/2ML IM SOLN
60.0000 mg | Freq: Once | INTRAMUSCULAR | Status: AC
  Administered 2013-10-24: 60 mg via INTRAMUSCULAR
  Filled 2013-10-24: qty 2

## 2013-10-24 MED ORDER — IBUPROFEN 800 MG PO TABS: 800.0000 mg | ORAL_TABLET | Freq: Three times a day (TID) | ORAL | Status: DC | PRN

## 2013-10-24 MED ORDER — METHOCARBAMOL 500 MG PO TABS: 500.0000 mg | ORAL_TABLET | Freq: Three times a day (TID) | ORAL | Status: DC | PRN

## 2013-10-24 NOTE — ED Provider Notes (Signed)
CSN: [id number]     Arrival date & time 10/24/13  1103 History  This chart was scribed for non-physician practitioner, Clayton Bibles, PA-C working with Carmin Muskrat, MD by Frederich Balding, ED scribe. This patient was seen in room TR08C/TR08C and the patient's care was started at 12:45 PM.   Chief Complaint  Patient presents with  . Sciatica   The history is provided by the patient. No language interpreter was used.   HPI Comments: Brenda Peters is a 58 y.o. female who presents to the Emergency Department complaining of gradual onset right lower back pain that radiates into her right groin and thigh that started yesterday. Pt has some weakness and numbness in her right leg and foot. She states she was lifting heavy objects and sweeping at work before the pain started. Pt has history of sciatica 10 years ago and states this is the same pain. She has been given a shot in her back in the past. Denies fever, cough, sore throat, abdominal pain, diarrhea, emesis, dysuria, hematuria, urinary frequency, urgency, vaginal discharge, vaginal bleeding, bowel or bladder incontinence, saddle anesthesia.   PCP is Dr. Darra Lis  Past Medical History  Diagnosis Date  . Heart palpitations   . Heart murmur   . Hypertension    Past Surgical History  Procedure Laterality Date  . Tubal ligation     Family History  Problem Relation Age of Onset  . Diabetes Mother   . Hypertension Mother    History  Substance Use Topics  . Smoking status: Never Smoker   . Smokeless tobacco: Not on file  . Alcohol Use: No   OB History   Grav Para Term Preterm Abortions TAB SAB Ect Mult Living                 Review of Systems  Constitutional: Negative for fever.  HENT: Negative for sore throat.   Respiratory: Negative for cough.   Gastrointestinal: Negative for vomiting, abdominal pain and diarrhea.  Genitourinary: Negative for dysuria, urgency, frequency, hematuria, vaginal bleeding and vaginal discharge.        Negative for bowel or bladder incontinence.  Musculoskeletal: Positive for back pain and myalgias.  Neurological: Positive for weakness and numbness.  All other systems reviewed and are negative.  Allergies  Codeine  Home Medications   Current Outpatient Rx  Name  Route  Sig  Dispense  Refill  . amLODipine (NORVASC) 2.5 MG tablet   Oral   Take 2.5 mg by mouth daily.         . benzonatate (TESSALON) 100 MG capsule   Oral   Take 1 capsule (100 mg total) by mouth every 8 (eight) hours.   21 capsule   0   . cyclobenzaprine (FLEXERIL) 10 MG tablet   Oral   Take 10 mg by mouth at bedtime as needed for muscle spasms.         Marland Kitchen guaiFENesin (ROBITUSSIN) 100 MG/5ML liquid   Oral   Take 5-10 mLs (100-200 mg total) by mouth every 4 (four) hours as needed for cough.   60 mL   0   . hydrochlorothiazide (HYDRODIURIL) 25 MG tablet   Oral   Take 25 mg by mouth daily.         Marland Kitchen ibuprofen (ADVIL,MOTRIN) 800 MG tablet   Oral   Take 800 mg by mouth every 8 (eight) hours as needed (pain).         . Multiple Vitamin (MULTIVITAMIN WITH MINERALS)  TABS   Oral   Take 1 tablet by mouth daily.         Marland Kitchen omega-3 acid ethyl esters (LOVAZA) 1 G capsule   Oral   Take 1 g by mouth daily.         . vitamin B-12 (CYANOCOBALAMIN) 1000 MCG tablet   Oral   Take 1,000 mcg by mouth daily.          BP 126/79  Pulse 76  Temp(Src) 97.9 F (36.6 C) (Oral)  Resp 18  Ht 5\' 2"  (1.575 m)  Wt 160 lb 6.4 oz (72.757 kg)  BMI 29.33 kg/m2  SpO2 96%  LMP 12/29/2012  Physical Exam  Nursing note and vitals reviewed. Constitutional: She appears well-developed and well-nourished. No distress.  HENT:  Head: Normocephalic and atraumatic.  Neck: Neck supple.  Pulmonary/Chest: Effort normal.  Abdominal: Soft. She exhibits no mass. There is no tenderness. There is no rebound and no guarding.  Musculoskeletal:  Lower extremities:  Strength 5/5, sensation intact, distal pulses intact.    Spine nontender, no crepitus, or stepoffs. Tenderness in right lower back.   Neurological: She is alert.  Skin: She is not diaphoretic.    ED Course  Procedures (including critical care time)  DIAGNOSTIC STUDIES: Oxygen Saturation is 96% on RA, normal by my interpretation.    COORDINATION OF CARE: 12:49 PM-Discussed treatment plan which includes Toradol with pt at bedside and pt agreed to plan. Advised pt to follow up with her PCP.  Labs Review Labs Reviewed - No data to display Imaging Review No results found.   EKG Interpretation None      MDM   Final diagnoses:  Lumbar radiculopathy  Low back pain    Afebrile, nontoxic patient with right sided low back pain with radiculopathy but neurovascularly intact. No red flags.  Given toradol in ED and d/c home with ibuprofen and robaxin.  PCP follow up.  Discussed  findings, treatment, and follow up  with patient.  Pt given return precautions.  Pt verbalizes understanding and agrees with plan.       I personally performed the services described in this documentation, which was scribed in my presence. The recorded information has been reviewed and is accurate.  Clayton Bibles, PA-C 10/24/13 1656

## 2013-10-24 NOTE — ED Notes (Signed)
Per pt sts she was at work yesterday and aggravated her sciatic nerve. sts pain in her right lower back radiating into right groin and down leg. sts she was sweeping when it started.

## 2013-10-24 NOTE — Discharge Instructions (Signed)
Read the information below.  Use the prescribed medication as directed.  Please discuss all new medications with your pharmacist.  You may return to the Emergency Department at any time for worsening condition or any new symptoms that concern you.    If you develop fevers, loss of control of bowel or bladder, weakness or numbness in your legs, or are unable to walk, return to the ER for a recheck.  ° °Back Pain, Adult °Low back pain is very common. About 1 in 5 people have back pain. The cause of low back pain is rarely dangerous. The pain often gets better over time. About half of people with a sudden onset of back pain feel better in just 2 weeks. About 8 in 10 people feel better by 6 weeks.  °CAUSES °Some common causes of back pain include: °· Strain of the muscles or ligaments supporting the spine. °· Wear and tear (degeneration) of the spinal discs. °· Arthritis. °· Direct injury to the back. °DIAGNOSIS °Most of the time, the direct cause of low back pain is not known. However, back pain can be treated effectively even when the exact cause of the pain is unknown. Answering your caregiver's questions about your overall health and symptoms is one of the most accurate ways to make sure the cause of your pain is not dangerous. If your caregiver needs more information, he or she may order lab work or imaging tests (X-rays or MRIs). However, even if imaging tests show changes in your back, this usually does not require surgery. °HOME CARE INSTRUCTIONS °For many people, back pain returns. Since low back pain is rarely dangerous, it is often a condition that people can learn to manage on their own.  °· Remain active. It is stressful on the back to sit or stand in one place. Do not sit, drive, or stand in one place for more than 30 minutes at a time. Take short walks on level surfaces as soon as pain allows. Try to increase the length of time you walk each day. °· Do not stay in bed. Resting more than 1 or 2 days can  delay your recovery. °· Do not avoid exercise or work. Your body is made to move. It is not dangerous to be active, even though your back may hurt. Your back will likely heal faster if you return to being active before your pain is gone. °· Pay attention to your body when you  bend and lift. Many people have less discomfort when lifting if they bend their knees, keep the load close to their bodies, and avoid twisting. Often, the most comfortable positions are those that put less stress on your recovering back. °· Find a comfortable position to sleep. Use a firm mattress and lie on your side with your knees slightly bent. If you lie on your back, put a pillow under your knees. °· Only take over-the-counter or prescription medicines as directed by your caregiver. Over-the-counter medicines to reduce pain and inflammation are often the most helpful. Your caregiver may prescribe muscle relaxant drugs. These medicines help dull your pain so you can more quickly return to your normal activities and healthy exercise. °· Put ice on the injured area. °· Put ice in a plastic bag. °· Place a towel between your skin and the bag. °· Leave the ice on for 15-20 minutes, 03-04 times a day for the first 2 to 3 days. After that, ice and heat may be alternated to reduce pain and spasms. °· Ask your caregiver about trying back exercises and gentle massage. This may be of some   benefit.  Avoid feeling anxious or stressed.Stress increases muscle tension and can worsen back pain.It is important to recognize when you are anxious or stressed and learn ways to manage it.Exercise is a great option. SEEK MEDICAL CARE IF:  You have pain that is not relieved with rest or medicine.  You have pain that does not improve in 1 week.  You have new symptoms.  You are generally not feeling well. SEEK IMMEDIATE MEDICAL CARE IF:   You have pain that radiates from your back into your legs.  You develop new bowel or bladder control  problems.  You have unusual weakness or numbness in your arms or legs.  You develop nausea or vomiting.  You develop abdominal pain.  You feel faint. Document Released: 06/30/2005 Document Revised: 12/30/2011 Document Reviewed: 11/18/2010 [facility] Patient Information 2014 [place], [place].  Lumbosacral Radiculopathy Lumbosacral radiculopathy is a pinched nerve or nerves in the low back (lumbosacral area). When this happens you may have weakness in your legs and may not be able to stand on your toes. You may have pain going down into your legs. There may be difficulties with walking normally. There are many causes of this problem. Sometimes this may happen from an injury, or simply from arthritis or boney problems. It may also be caused by other illnesses such as diabetes. If there is no improvement after treatment, further studies may be done to find the exact cause. DIAGNOSIS  X-rays may be needed if the problems become long standing. Electromyograms may be done. This study is one in which the working of nerves and muscles is studied. HOME CARE INSTRUCTIONS   Applications of ice packs may be helpful. Ice can be used in a plastic bag with a towel around it to prevent frostbite to skin. This may be used every 2 hours for 20 to 30 minutes, or as needed, while awake, or as directed by your caregiver.  Only take over-the-counter or prescription medicines for pain, discomfort, or fever as directed by your caregiver.  If physical therapy was prescribed, follow your caregiver's directions. SEEK IMMEDIATE MEDICAL CARE IF:   You have pain not controlled with medications.  You seem to be getting worse rather than better.  You develop increasing weakness in your legs.  You develop loss of bowel or bladder control.  You have difficulty with walking or balance, or develop clumsiness in the use of your legs.  You have a fever. MAKE SURE YOU:   Understand these instructions.  Will watch your  condition.  Will get help right away if you are not doing well or get worse. Document Released: 06/30/2005 Document Revised: 09/22/2011 Document Reviewed: 02/18/2008 [facility] Patient Information 2014 [place].

## 2013-10-27 NOTE — ED Provider Notes (Signed)
Medical screening examination/treatment/procedure(s) were performed by non-physician practitioner and as supervising physician I was immediately available for consultation/collaboration.  Carmin Muskrat, MD 10/27/13 [phone]

## 2013-11-10 ENCOUNTER — Encounter (HOSPITAL_COMMUNITY): Payer: Self-pay | Admitting: Emergency Medicine

## 2013-11-10 ENCOUNTER — Emergency Department (HOSPITAL_COMMUNITY)
Admission: EM | Admit: 2013-11-10 | Discharge: 2013-11-10 | Disposition: A | Discharge status: Home / Self Care | Payer: BC Managed Care – PPO | Attending: Emergency Medicine | Admitting: Emergency Medicine

## 2013-11-10 ENCOUNTER — Emergency Department (HOSPITAL_COMMUNITY): Payer: BC Managed Care – PPO

## 2013-11-10 DIAGNOSIS — Y9301 Activity, walking, marching and hiking: Secondary | ICD-10-CM | POA: Insufficient documentation

## 2013-11-10 DIAGNOSIS — E7800 Pure hypercholesterolemia, unspecified: Secondary | ICD-10-CM | POA: Insufficient documentation

## 2013-11-10 DIAGNOSIS — I10 Essential (primary) hypertension: Secondary | ICD-10-CM | POA: Insufficient documentation

## 2013-11-10 DIAGNOSIS — R011 Cardiac murmur, unspecified: Secondary | ICD-10-CM | POA: Insufficient documentation

## 2013-11-10 DIAGNOSIS — Y9289 Other specified places as the place of occurrence of the external cause: Secondary | ICD-10-CM | POA: Insufficient documentation

## 2013-11-10 DIAGNOSIS — Z885 Allergy status to narcotic agent status: Secondary | ICD-10-CM | POA: Insufficient documentation

## 2013-11-10 DIAGNOSIS — S93409A Sprain of unspecified ligament of unspecified ankle, initial encounter: Secondary | ICD-10-CM | POA: Insufficient documentation

## 2013-11-10 DIAGNOSIS — Z79899 Other long term (current) drug therapy: Secondary | ICD-10-CM | POA: Insufficient documentation

## 2013-11-10 DIAGNOSIS — S93402A Sprain of unspecified ligament of left ankle, initial encounter: Secondary | ICD-10-CM

## 2013-11-10 DIAGNOSIS — IMO0002 Reserved for concepts with insufficient information to code with codable children: Secondary | ICD-10-CM | POA: Insufficient documentation

## 2013-11-10 DIAGNOSIS — R002 Palpitations: Secondary | ICD-10-CM | POA: Insufficient documentation

## 2013-11-10 DIAGNOSIS — M259 Joint disorder, unspecified: Secondary | ICD-10-CM | POA: Insufficient documentation

## 2013-11-10 HISTORY — DX: Pure hypercholesterolemia, unspecified: E78.00

## 2013-11-10 NOTE — Discharge Instructions (Signed)
You may take tylenol and ibuprofen as needed for pain. You may also take your tramadol as needed for severe pain. Be sure to follow up with primary care and orthopedics if symptoms not improving. See below for further instruction.   Acute Ankle Sprain with Phase I Rehab An acute ankle sprain is a partial or complete tear in one or more of the ligaments of the ankle due to traumatic injury. The severity of the injury depends on both the the number of ligaments sprained and the grade of sprain. There are 3 grades of sprains.   A grade 1 sprain is a mild sprain. There is a slight pull without obvious tearing. There is no loss of strength, and the muscle and ligament are the correct length.  A grade 2 sprain is a moderate sprain. There is tearing of fibers within the substance of the ligament where it connects two bones or two cartilages. The length of the ligament is increased, and there is usually decreased strength.  A grade 3 sprain is a complete rupture of the ligament and is uncommon. In addition to the grade of sprain, there are three types of ankle sprains.  Lateral ankle sprains: This is a sprain of one or more of the three ligaments on the outer side (lateral) of the ankle. These are the most common sprains. Medial ankle sprains: There is one large triangular ligament of the inner side (medial) of the ankle that is susceptible to injury. Medial ankle sprains are less common. Syndesmosis, "high ankle," sprains: The syndesmosis is the ligament that connects the two bones of the lower leg. Syndesmosis sprains usually only occur with very severe ankle sprains. SYMPTOMS  Pain, tenderness, and swelling in the ankle, starting at the side of injury that may progress to the whole ankle and foot with time.  "Pop" or tearing sensation at the time of injury.  Bruising that may spread to the heel.  Impaired ability to walk soon after injury. CAUSES   Acute ankle sprains are caused by trauma  placed on the ankle that temporarily forces or pries the anklebone (talus) out of its normal socket.  Stretching or tearing of the ligaments that normally hold the joint in place (usually due to a twisting injury). RISK INCREASES WITH:  Previous ankle sprain.  Sports in which the foot may land awkwardly (ie. basketball, volleyball, or soccer) or walking or running on uneven or rough surfaces.  Shoes with inadequate support to prevent sideways motion when stress occurs.  Poor strength and flexibility.  Poor balance skills.  Contact sports. PREVENTION   Warm up and stretch properly before activity.  Maintain physical fitness:  Ankle and leg flexibility, muscle strength, and endurance.  Cardiovascular fitness.  Balance training activities.  Use proper technique and have a coach correct improper technique.  Taping, protective strapping, bracing, or high-top tennis shoes may help prevent injury. Initially, tape is best; however, it loses most of its support function within 10 to 15 minutes.  Wear proper fitted protective shoes (High-top shoes with taping or bracing is more effective than either alone).  Provide the ankle with support during sports and practice activities for 12 months following injury. PROGNOSIS   If treated properly, ankle sprains can be expected to recover completely; however, the length of recovery depends on the degree of injury.  A grade 1 sprain usually heals enough in 5 to 7 days to allow modified activity and requires an average of 6 weeks to heal completely.  A  grade 2 sprain requires 6 to 10 weeks to heal completely.  A grade 3 sprain requires 12 to 16 weeks to heal.  A syndesmosis sprain often takes more than 3 months to heal. RELATED COMPLICATIONS   Frequent recurrence of symptoms may result in a chronic problem. Appropriately addressing the problem the first time decreases the frequency of recurrence and optimizes healing time. Severity of the  initial sprain does not predict the likelihood of later instability.  Injury to other structures (bone, cartilage, or tendon).  A chronically unstable or arthritic ankle joint is a possiblity with repeated sprains. TREATMENT Treatment initially involves the use of ice, medication, and compression bandages to help reduce pain and inflammation. Ankle sprains are usually immobilized in a walking cast or boot to allow for healing. Crutches may be recommended to reduce pressure on the injury. After immobilization, strengthening and stretching exercises may be necessary to regain strength and a full range of motion. Surgery is rarely needed to treat ankle sprains. MEDICATION   Nonsteroidal anti-inflammatory medications, such as aspirin and ibuprofen (do not take for the first 3 days after injury or within 7 days before surgery), or other minor pain relievers, such as acetaminophen, are often recommended. Take these as directed by your caregiver. Contact your caregiver immediately if any bleeding, stomach upset, or signs of an allergic reaction occur from these medications.  Ointments applied to the skin may be helpful.  Pain relievers may be prescribed as necessary by your caregiver. Do not take prescription pain medication for longer than 4 to 7 days. Use only as directed and only as much as you need. HEAT AND COLD  Cold treatment (icing) is used to relieve pain and reduce inflammation for acute and chronic cases. Cold should be applied for 10 to 15 minutes every 2 to 3 hours for inflammation and pain and immediately after any activity that aggravates your symptoms. Use ice packs or an ice massage.  Heat treatment may be used before performing stretching and strengthening activities prescribed by your caregiver. Use a heat pack or a warm soak. SEEK IMMEDIATE MEDICAL CARE IF:   Pain, swelling, or bruising worsens despite treatment.  You experience pain, numbness, discoloration, or coldness in the  foot or toes.  New, unexplained symptoms develop (drugs used in treatment may produce side effects.) EXERCISES  PHASE I EXERCISES RANGE OF MOTION (ROM) AND STRETCHING EXERCISES - Ankle Sprain, Acute Phase I, Weeks 1 to 2 These exercises may help you when beginning to restore flexibility in your ankle. You will likely work on these exercises for the 1 to 2 weeks after your injury. Once your physician, physical therapist, or athletic trainer sees adequate progress, he or she will advance your exercises. While completing these exercises, remember:   Restoring tissue flexibility helps normal motion to return to the joints. This allows healthier, less painful movement and activity.  An effective stretch should be held for at least 30 seconds.  A stretch should never be painful. You should only feel a gentle lengthening or release in the stretched tissue. RANGE OF MOTION - Dorsi/Plantar Flexion  While sitting with your right / left knee straight, draw the top of your foot upwards by flexing your ankle. Then reverse the motion, pointing your toes downward.  Hold each position for __________ seconds.  After completing your first set of exercises, repeat this exercise with your knee bent. Repeat __________ times. Complete this exercise __________ times per day.  RANGE OF MOTION - Ankle Alphabet  Imagine your right / left big toe is a pen.  Keeping your hip and knee still, write out the entire alphabet with your "pen." Make the letters as large as you can without increasing any discomfort. Repeat __________ times. Complete this exercise __________ times per day.  STRENGTHENING EXERCISES - Ankle Sprain, Acute -Phase I, Weeks 1 to 2 These exercises may help you when beginning to restore strength in your ankle. You will likely work on these exercises for 1 to 2 weeks after your injury. Once your physician, physical therapist, or athletic trainer sees adequate progress, he or she will advance your  exercises. While completing these exercises, remember:   Muscles can gain both the endurance and the strength needed for everyday activities through controlled exercises.  Complete these exercises as instructed by your physician, physical therapist, or athletic trainer. Progress the resistance and repetitions only as guided.  You may experience muscle soreness or fatigue, but the pain or discomfort you are trying to eliminate should never worsen during these exercises. If this pain does worsen, stop and make certain you are following the directions exactly. If the pain is still present after adjustments, discontinue the exercise until you can discuss the trouble with your clinician. STRENGTH - Dorsiflexors  Secure a rubber exercise band/tubing to a fixed object (ie. table, pole) and loop the other end around your right / left foot.  Sit on the floor facing the fixed object. The band/tubing should be slightly tense when your foot is relaxed.  Slowly draw your foot back toward you using your ankle and toes.  Hold this position for __________ seconds. Slowly release the tension in the band and return your foot to the starting position. Repeat __________ times. Complete this exercise __________ times per day.  STRENGTH - Plantar-flexors   Sit with your right / left leg extended. Holding onto both ends of a rubber exercise band/tubing, loop it around the ball of your foot. Keep a slight tension in the band.  Slowly push your toes away from you, pointing them downward.  Hold this position for __________ seconds. Return slowly, controlling the tension in the band/tubing. Repeat __________ times. Complete this exercise __________ times per day.  STRENGTH - Ankle Eversion  Secure one end of a rubber exercise band/tubing to a fixed object (table, pole). Loop the other end around your foot just before your toes.  Place your fists between your knees. This will focus your strengthening at your  ankle.  Drawing the band/tubing across your opposite foot, slowly, pull your little toe out and up. Make sure the band/tubing is positioned to resist the entire motion.  Hold this position for __________ seconds. Have your muscles resist the band/tubing as it slowly pulls your foot back to the starting position.  Repeat __________ times. Complete this exercise __________ times per day.  STRENGTH - Ankle Inversion  Secure one end of a rubber exercise band/tubing to a fixed object (table, pole). Loop the other end around your foot just before your toes.  Place your fists between your knees. This will focus your strengthening at your ankle.  Slowly, pull your big toe up and in, making sure the band/tubing is positioned to resist the entire motion.  Hold this position for __________ seconds.  Have your muscles resist the band/tubing as it slowly pulls your foot back to the starting position. Repeat __________ times. Complete this exercises __________ times per day.  STRENGTH - Towel Curls  Sit in a chair positioned on a  non-carpeted surface.  Place your right / left foot on a towel, keeping your heel on the floor.  Pull the towel toward your heel by only curling your toes. Keep your heel on the floor.  If instructed by your physician, physical therapist, or athletic trainer, add weight to the end of the towel. Repeat __________ times. Complete this exercise __________ times per day. Document Released: 01/29/2005 Document Revised: 09/22/2011 Document Reviewed: 10/12/2008 [facility] Patient Information 2014 [place], [place].

## 2013-11-10 NOTE — ED Notes (Signed)
Pt reports injuring left ankle at work one week ago, swelling noted.

## 2013-11-10 NOTE — ED Provider Notes (Signed)
CSN: [id number]     Arrival date & time 11/10/13  1353 History  This chart was scribed for Noland Fordyce, PA working with Ezequiel Essex, MD by Roxan Diesel, ED Scribe. This patient was seen in room TR06C/TR06C and the patient's care was started at 3:13 PM.   Chief Complaint  Patient presents with  . Ankle Pain    The history is provided by the patient. No language interpreter was used.    HPI Comments: Brenda Peters is a 58 y.o. female who presents to the Emergency Department complaining of a left ankle injury sustained one week ago.  Pt states she was walking while carrying some obects at work when she rolled her left ankle outward and heard a "little crunch."  Sine then she has had constant moderate pain to the outside of the left foot.  Pain is worsened by bearing weight but she has been walking and standing all day at work for the past week since the injury.  She has been wearing an OTC ankle brace and supportive shoes.  She has also used ice with some relief.  She denies pain or injury to any other area.     Past Medical History  Diagnosis Date  . Heart palpitations   . Heart murmur   . Hypertension   . High cholesterol     Past Surgical History  Procedure Laterality Date  . Tubal ligation      Family History  Problem Relation Age of Onset  . Diabetes Mother   . Hypertension Mother     History  Substance Use Topics  . Smoking status: Never Smoker   . Smokeless tobacco: Not on file  . Alcohol Use: No    OB History   Grav Para Term Preterm Abortions TAB SAB Ect Mult Living                   Review of Systems  Musculoskeletal: Positive for arthralgias (left ankle) and joint swelling (left ankle).  All other systems reviewed and are negative.     Allergies  Codeine  Home Medications   Prior to Admission medications   Medication Sig Start Date End Date Taking? Authorizing Provider  amLODipine (NORVASC) 2.5 MG tablet Take 2.5 mg by mouth daily.     Historical Provider, MD  hydrochlorothiazide (HYDRODIURIL) 25 MG tablet Take 25 mg by mouth daily.    Historical Provider, MD  ibuprofen (ADVIL,MOTRIN) 800 MG tablet Take 800 mg by mouth every 8 (eight) hours as needed (pain).    Historical Provider, MD  ibuprofen (ADVIL,MOTRIN) 800 MG tablet Take 1 tablet (800 mg total) by mouth every 8 (eight) hours as needed for mild pain or moderate pain. 10/24/13   Clayton Bibles, PA-C  lovastatin (MEVACOR) 20 MG tablet Take 20 mg by mouth at bedtime.    Historical Provider, MD  methocarbamol (ROBAXIN) 500 MG tablet Take 1 tablet (500 mg total) by mouth every 8 (eight) hours as needed for muscle spasms (or pain). 10/24/13   Clayton Bibles, PA-C  Multiple Vitamin (MULTIVITAMIN WITH MINERALS) TABS Take 1 tablet by mouth daily.    Historical Provider, MD  omega-3 acid ethyl esters (LOVAZA) 1 G capsule Take 1 g by mouth daily.    Historical Provider, MD  traMADol (ULTRAM) 50 MG tablet Take 1 tablet by mouth 2 (two) times daily as needed. For pain 10/15/13   Historical Provider, MD  vitamin B-12 (CYANOCOBALAMIN) 1000 MCG tablet Take 1,000 mcg by mouth daily.  Historical Provider, MD  Vitamin D, Ergocalciferol, (DRISDOL) 50000 UNITS CAPS capsule Take 1 capsule by mouth once a week. Thursdays 09/22/13   Historical Provider, MD   BP 109/76  Pulse 75  Temp(Src) 98.3 F (36.8 C) (Oral)  Resp 18  Ht 5\' 2"  (1.575 m)  Wt 161 lb (73.029 kg)  BMI 29.44 kg/m2  SpO2 99%  LMP 12/29/2012  Physical Exam  Nursing note and vitals reviewed. Constitutional: She is oriented to person, place, and time. She appears well-developed and well-nourished.  HENT:  Head: Normocephalic and atraumatic.  Eyes: EOM are normal.  Neck: Normal range of motion.  Cardiovascular: Normal rate.   Pulmonary/Chest: Effort normal.  Musculoskeletal:  Moderate edema to lateral left malleolus, with tenderness.  No ecchymosis.  Decreased plantar flexion and dorsiflexion due to pain.  Full ROM all five toes.   Sensation intact.  Pedal pulses intact.  Neurological: She is alert and oriented to person, place, and time.  Skin: Skin is warm and dry.  Psychiatric: She has a normal mood and affect. Her behavior is normal.    ED Course  Procedures (including critical care time)  DIAGNOSTIC STUDIES: Oxygen Saturation is 99% on room air, normal by my interpretation.    COORDINATION OF CARE: 3:18 PM-Informed pt that x-ray is negative and pain is likely due to an ankle sprain.  Discussed treatment plan which includes RICE treatment and orthopedic f/u if needed with pt at bedside and pt agreed to plan.     Labs Review Labs Reviewed - No data to display   Imaging Review Dg Ankle Complete Left  11/10/2013   CLINICAL DATA:  Twisted left ankle last Friday  EXAM: LEFT ANKLE COMPLETE - 3+ VIEW  COMPARISON:  None.  FINDINGS: There is no evidence of fracture, dislocation, or joint effusion. There is no evidence of arthropathy or other focal bone abnormality. Soft tissues are unremarkable.  IMPRESSION: Negative.   Electronically Signed   By: Kathreen Devoid   On: 11/10/2013 15:07     EKG Interpretation None      MDM   Final diagnoses:  Left ankle sprain   Patient complaint left ankle pain no evidence of fracture will treat ASO splint. Advised to followup with PCP in orthopedics as needed for continued pain the patient verbalized understanding in agreement with treatment.  I personally performed the services described in this documentation, which was scribed in my presence. The recorded information has been reviewed and is accurate.    Noland Fordyce, PA-C 11/10/13 [place], PA-C 11/10/13 [phone]

## 2013-11-10 NOTE — ED Provider Notes (Signed)
Medical screening examination/treatment/procedure(s) were performed by non-physician practitioner and as supervising physician I was immediately available for consultation/collaboration.   EKG Interpretation None        Ezequiel Essex, MD 11/10/13 1742

## 2013-11-10 NOTE — ED Notes (Signed)
Pt ambulates without distress. Pt alert x4

## 2014-01-16 ENCOUNTER — Emergency Department (HOSPITAL_COMMUNITY): Payer: BC Managed Care – PPO

## 2014-01-16 ENCOUNTER — Encounter (HOSPITAL_COMMUNITY): Payer: Self-pay | Admitting: Emergency Medicine

## 2014-01-16 ENCOUNTER — Emergency Department (HOSPITAL_COMMUNITY)
Admission: EM | Admit: 2014-01-16 | Discharge: 2014-01-16 | Disposition: A | Discharge status: Home / Self Care | Payer: BC Managed Care – PPO | Attending: Emergency Medicine | Admitting: Emergency Medicine

## 2014-01-16 DIAGNOSIS — R519 Headache, unspecified: Secondary | ICD-10-CM

## 2014-01-16 DIAGNOSIS — M546 Pain in thoracic spine: Secondary | ICD-10-CM | POA: Insufficient documentation

## 2014-01-16 DIAGNOSIS — R002 Palpitations: Secondary | ICD-10-CM | POA: Insufficient documentation

## 2014-01-16 DIAGNOSIS — Z79899 Other long term (current) drug therapy: Secondary | ICD-10-CM | POA: Insufficient documentation

## 2014-01-16 DIAGNOSIS — R079 Chest pain, unspecified: Secondary | ICD-10-CM | POA: Insufficient documentation

## 2014-01-16 DIAGNOSIS — M542 Cervicalgia: Secondary | ICD-10-CM | POA: Insufficient documentation

## 2014-01-16 DIAGNOSIS — R51 Headache: Secondary | ICD-10-CM | POA: Insufficient documentation

## 2014-01-16 DIAGNOSIS — R011 Cardiac murmur, unspecified: Secondary | ICD-10-CM | POA: Insufficient documentation

## 2014-01-16 DIAGNOSIS — M549 Dorsalgia, unspecified: Secondary | ICD-10-CM

## 2014-01-16 DIAGNOSIS — I10 Essential (primary) hypertension: Secondary | ICD-10-CM | POA: Insufficient documentation

## 2014-01-16 DIAGNOSIS — Z885 Allergy status to narcotic agent status: Secondary | ICD-10-CM | POA: Insufficient documentation

## 2014-01-16 LAB — HEPATIC FUNCTION PANEL
ALBUMIN: 3.3 g/dL — AB (ref 3.5–5.2)
ALT: 15 U/L (ref 0–35)
AST: 22 U/L (ref 0–37)
Alkaline Phosphatase: 37 U/L — ABNORMAL LOW (ref 39–117)
Bilirubin, Direct: <0.2 mg/dL (ref 0.0–0.3)
TOTAL PROTEIN: 5.9 g/dL — AB (ref 6.0–8.3)
Total Bilirubin: 0.6 mg/dL (ref 0.3–1.2)

## 2014-01-16 LAB — BASIC METABOLIC PANEL
Anion gap: 13 (ref 5–15)
BUN: 10 mg/dL (ref 6–23)
CALCIUM: 8.9 mg/dL (ref 8.4–10.5)
CO2: 27 mEq/L (ref 19–32)
Chloride: 101 mEq/L (ref 96–112)
Creatinine, Ser: 0.95 mg/dL (ref 0.50–1.10)
GFR calc Af Amer: 77 mL/min — ABNORMAL LOW (ref >90)
GFR, EST NON AFRICAN AMERICAN: 66 mL/min — AB (ref >90)
GLUCOSE: 98 mg/dL (ref 70–99)
Potassium: 3.4 mEq/L — ABNORMAL LOW (ref 3.7–5.3)
SODIUM: 141 meq/L (ref 137–147)

## 2014-01-16 LAB — CBC
HCT: 42.3 % (ref 36.0–46.0)
HEMOGLOBIN: 14.5 g/dL (ref 12.0–15.0)
MCH: 31.3 pg (ref 26.0–34.0)
MCHC: 34.3 g/dL (ref 30.0–36.0)
MCV: 91.2 fL (ref 78.0–100.0)
Platelets: 230 10*3/uL (ref 150–400)
RBC: 4.64 MIL/uL (ref 3.87–5.11)
RDW: 12.9 % (ref 11.5–15.5)
WBC: 6.4 10*3/uL (ref 4.0–10.5)

## 2014-01-16 LAB — I-STAT TROPONIN, ED: Troponin i, poc: 0 ng/mL (ref 0.00–0.08)

## 2014-01-16 LAB — PRO B NATRIURETIC PEPTIDE: Pro B Natriuretic peptide (BNP): 122.4 pg/mL (ref 0–125)

## 2014-01-16 LAB — CK: CK TOTAL: 223 U/L — AB (ref 7–177)

## 2014-01-16 MED ORDER — ACETAMINOPHEN 500 MG PO TABS
1000.0000 mg | ORAL_TABLET | Freq: Once | ORAL | Status: AC
  Administered 2014-01-16: 1000 mg via ORAL
  Filled 2014-01-16: qty 2

## 2014-01-16 MED ORDER — TRAMADOL HCL 50 MG PO TABS: 50.0000 mg | ORAL_TABLET | Freq: Four times a day (QID) | ORAL | Status: DC | PRN

## 2014-01-16 MED ORDER — TRAMADOL HCL 50 MG PO TABS
50.0000 mg | ORAL_TABLET | Freq: Once | ORAL | Status: AC
  Administered 2014-01-16: 50 mg via ORAL
  Filled 2014-01-16: qty 1

## 2014-01-16 NOTE — ED Notes (Signed)
Medical screening examination/treatment/procedure(s) were conducted as a shared visit with non-physician practitioner(s) or resident and myself. I personally evaluated the patient during the encounter  I have personally reviewed any xrays and/ or EKG's with the provider and I agree with interpretation.  Patient presents with intermittent left neck and chest pain the past few days. He describes it as both sharp and dull ache. No association with exertion and no diaphoresis or nausea. Patient currently denied chest pain during my exam. Patient denies blood clot history, recent surgery, hemoptysis or leg pain leg swelling. Patient explains primary concern is her neck pain which started 2 weeks ago and felt like and not and neck and now surrounding muscle is tight tender to palpation with movement. No weakness or numbness in arms or legs. No history of disc disease or known radicular symptoms. Mild radiation down the left arm.  Patient does not have any blood clot or cardiac history. Patient has 2 risk factors for cardiac and plan for screening troponin this. Patient has 5+ strength upper extremities with shoulder abduction, biceps flexion, wrist extension, normal sensation upper extremities normal pulses bilateral. Lungs are clear and heart regular rate and rhythm. Discussed close followup outpatient and reasons to return.  Labs Reviewed  BASIC METABOLIC PANEL - Abnormal; Notable for the following:    Potassium 3.4 (*)    GFR calc non Af Amer 66 (*)    GFR calc Af Amer 77 (*)    All other components within normal limits  CK - Abnormal; Notable for the following:    Total CK 223 (*)    All other components within normal limits  HEPATIC FUNCTION PANEL - Abnormal; Notable for the following:    Total Protein 5.9 (*)    Albumin 3.3 (*)    Alkaline Phosphatase 37 (*)    All other components within normal limits  CBC  PRO B NATRIURETIC PEPTIDE  I-STAT TROPOININ, ED    Atypical chest pain, hypertension  history, neck strain   Mariea Clonts, MD 01/16/14 [phone]

## 2014-01-16 NOTE — ED Provider Notes (Signed)
CSN: [id number]     Arrival date & time 01/16/14  1214 History   First MD Initiated Contact with Patient 01/16/14 1309     Chief Complaint  Patient presents with  . Chest Pain     (Consider location/radiation/quality/duration/timing/severity/associated sxs/prior Treatment) HPI Pt is a 58yo female with hx of heart palpitations, heart murmur, HTN, and high cholesterol presenting to ED c/o left sided chest pain since last night that radiates to jaw, pain was sharp in nature, 7/10, however today, pain is described as dull and intermittent, worse with movement and palpation of left anterior chest and left upper to mid back.  Reports mild SOB, dizziness and generalized weakness.  Denies diaphoresis, n/v/d. Denies recent illness, cough or congestion. Pt states she was recently started on a BP and cholesterol medication: amlodipine, HCTZ, and lovastatin but was concerned her PCP did not check her blood work prior to starting medications.  Pt reports intermittent pain and weakness for 2 weeks but worse pain started last night.  Pt also c/o generalized headache, worse in posterior head and left side.  Gradual in onset, feels similar to previous headaches, dull ache, however states she has not tried any pain medication as she was advised that her prescription for 800mg  ibuprofen would interfere with her BP medications.   Past Medical History  Diagnosis Date  . Heart palpitations   . Heart murmur   . Hypertension   . High cholesterol    Past Surgical History  Procedure Laterality Date  . Tubal ligation     Family History  Problem Relation Age of Onset  . Diabetes Mother   . Hypertension Mother    History  Substance Use Topics  . Smoking status: Never Smoker   . Smokeless tobacco: Not on file  . Alcohol Use: No   OB History   Grav Para Term Preterm Abortions TAB SAB Ect Mult Living                 Review of Systems  Constitutional: Negative for fever, chills, diaphoresis and fatigue.   Respiratory: Negative for cough and shortness of breath.   Cardiovascular: Positive for chest pain and palpitations. Negative for leg swelling.  Gastrointestinal: Negative for nausea, vomiting, abdominal pain and diarrhea.  All other systems reviewed and are negative.     Allergies  Codeine  Home Medications   Prior to Admission medications   Medication Sig Start Date End Date Taking? Authorizing Provider  amLODipine (NORVASC) 2.5 MG tablet Take 2.5 mg by mouth daily.   Yes Historical Provider, MD  hydrochlorothiazide (HYDRODIURIL) 25 MG tablet Take 25 mg by mouth daily.   Yes Historical Provider, MD  lovastatin (MEVACOR) 20 MG tablet Take 20 mg by mouth at bedtime.   Yes Historical Provider, MD  Multiple Vitamin (MULTIVITAMIN WITH MINERALS) TABS Take 1 tablet by mouth daily.   Yes Historical Provider, MD  Omega-3 Fatty Acids (FISH OIL) 1000 MG CAPS Take 2 capsules by mouth daily.   Yes Historical Provider, MD  vitamin B-12 (CYANOCOBALAMIN) 1000 MCG tablet Take 1,000 mcg by mouth daily.   Yes Historical Provider, MD  traMADol (ULTRAM) 50 MG tablet Take 1 tablet (50 mg total) by mouth every 6 (six) hours as needed. 01/16/14   Noland Fordyce, PA-C   BP 118/93  Pulse 64  Temp(Src) 98.2 F (36.8 C) (Oral)  Resp 16  SpO2 100%  LMP 12/29/2012 Physical Exam  Nursing note and vitals reviewed. Constitutional: She appears well-developed and well-nourished. No  distress.  Pt ambulated into room w/o difficulty. Appears well, NAD  HENT:  Head: Normocephalic and atraumatic.  Eyes: Conjunctivae are normal. No scleral icterus.  Neck: Normal range of motion. Neck supple.  Cardiovascular: Normal rate, regular rhythm and normal heart sounds.   Pulmonary/Chest: Effort normal and breath sounds normal. No respiratory distress. She has no wheezes. She has no rales. She exhibits tenderness ( left anterior chest wall).  No respiratory distress, able to speak in full sentences w/o difficulty. Lungs: CTAB   Abdominal: Soft. Bowel sounds are normal. She exhibits no distension and no mass. There is no tenderness. There is no rebound and no guarding.  Musculoskeletal: Normal range of motion. She exhibits tenderness.  Tenderness along musculature of left upper trapezius and left thoracic region. No midline spinal tenderness. FROM all extremities. 5/5 grip strength bilaterally.   Lymphadenopathy:    She has no cervical adenopathy.  Neurological: She is alert.  Skin: Skin is warm and dry. She is not diaphoretic.    ED Course  Procedures (including critical care time) Labs Review Labs Reviewed  BASIC METABOLIC PANEL - Abnormal; Notable for the following:    Potassium 3.4 (*)    GFR calc non Af Amer 66 (*)    GFR calc Af Amer 77 (*)    All other components within normal limits  CK - Abnormal; Notable for the following:    Total CK 223 (*)    All other components within normal limits  HEPATIC FUNCTION PANEL - Abnormal; Notable for the following:    Total Protein 5.9 (*)    Albumin 3.3 (*)    Alkaline Phosphatase 37 (*)    All other components within normal limits  CBC  PRO B NATRIURETIC PEPTIDE  I-STAT TROPOININ, ED    Imaging Review Dg Chest 2 View  01/16/2014   CLINICAL DATA:  Chest pain  EXAM: CHEST  2 VIEW  COMPARISON:  PA and lateral chest of January 28, 2013  FINDINGS: The lungs are well-expanded and clear. The heart and mediastinal structures are normal. The pulmonary vascularity is not engorged. There is no pleural effusion. The bony thorax is unremarkable.  IMPRESSION: There is no acute cardiopulmonary abnormality.   Electronically Signed   By: David  Martinique   On: 01/16/2014 13:07     EKG Interpretation None      MDM   Final diagnoses:  Left sided chest pain  Neck pain on left side  Upper back pain on left side  Headache, unspecified headache type   Pt is a 58yo female with hx of HTN, high cholesterol, heart murmur and heart palpitations presenting to ED c/o left sided  chest and left upper back tenderness radiating into jaw and left arm.  Symptoms started last night but pt concerned as she has had similar symptoms for 2 weeks and believes it is due to her BP medication. Pt states she is concerned as her PCP did not check her liver function.  Pt also c/o gradual onset headache.  CP is atypical in nature. Pt is low risk for major cardiac event based off HEART score.  Pain appears musculoskeletal in nature as it is reproducible with palpation and certain movements.  Dicussed pt with Dr. Reather Converse who also examined pt.   Cardiac workup: unremarkable.  Labs: CBC, BMP, BNP, and LFTs: unremarkable. CK: mild elevation.  Troponin: negative for elevation. CXR: no acute cardiopulmonary abnormality.  EKG: NSR.  Pt given tramadol, acetaminophen and ice pack in ED.  Pt states she has had tramadol in past w/o allergic reaction. Pain did improve in ED.  Will discharge home and have f/u with PCP.  Advised to discontinue cholesterol medication until f/u with PCP as pt believes this is cause of her symptoms. Return precautions provided. Pt verbalized understanding and agreement with tx plan. Discussed pt with Dr. Reather Converse who agrees with tx plan.     Noland Fordyce, PA-C 01/16/14 [place], PA-C 01/16/14 [phone]

## 2014-01-16 NOTE — ED Notes (Signed)
Pt reports left sided "sharp" chest pain since last night that radiates to jaw. States last night the pain was sharp, but today described as dull and intermittent. Reports mild SOB, dizziness and weakness. Denies diaphoresis, N/V. Pt in NAD.

## 2014-01-19 NOTE — ED Provider Notes (Signed)
Medical screening examination/treatment/procedure(s) were conducted as a shared visit with non-physician practitioner(s) and myself.  I personally evaluated the patient during the encounter.   EKG Interpretation   Date/Time:  Monday January 16 2014 15:17:58 EDT Ventricular Rate:  59 PR Interval:  140 QRS Duration: 93 QT Interval:  432 QTC Calculation: 428 R Axis:   56 Text Interpretation:  Sinus rhythm Low voltage, precordial leads ED  PHYSICIAN INTERPRETATION AVAILABLE IN [facility] Confirmed by TEST,  Record ([id number]) on 01/18/2014 7:15:36 AM     I reviewed the above EKG, hr 59, similar to previous, no acute ischemic findings, nl QT, nl axis. See separate note.  Mariea Clonts, MD 01/19/14 [phone]

## 2014-02-18 ENCOUNTER — Emergency Department (HOSPITAL_COMMUNITY)
Admission: EM | Admit: 2014-02-18 | Discharge: 2014-02-18 | Disposition: A | Discharge status: Home / Self Care | Payer: BC Managed Care – PPO | Attending: Emergency Medicine | Admitting: Emergency Medicine

## 2014-02-18 ENCOUNTER — Encounter (HOSPITAL_COMMUNITY): Payer: Self-pay | Admitting: Emergency Medicine

## 2014-02-18 DIAGNOSIS — Z79899 Other long term (current) drug therapy: Secondary | ICD-10-CM | POA: Insufficient documentation

## 2014-02-18 DIAGNOSIS — M7989 Other specified soft tissue disorders: Secondary | ICD-10-CM | POA: Insufficient documentation

## 2014-02-18 DIAGNOSIS — M25579 Pain in unspecified ankle and joints of unspecified foot: Secondary | ICD-10-CM | POA: Insufficient documentation

## 2014-02-18 DIAGNOSIS — Z8639 Personal history of other endocrine, nutritional and metabolic disease: Secondary | ICD-10-CM | POA: Insufficient documentation

## 2014-02-18 DIAGNOSIS — I10 Essential (primary) hypertension: Secondary | ICD-10-CM | POA: Insufficient documentation

## 2014-02-18 DIAGNOSIS — R011 Cardiac murmur, unspecified: Secondary | ICD-10-CM | POA: Insufficient documentation

## 2014-02-18 DIAGNOSIS — Z862 Personal history of diseases of the blood and blood-forming organs and certain disorders involving the immune mechanism: Secondary | ICD-10-CM | POA: Insufficient documentation

## 2014-02-18 DIAGNOSIS — M25572 Pain in left ankle and joints of left foot: Secondary | ICD-10-CM

## 2014-02-18 MED ORDER — HYDROCODONE-ACETAMINOPHEN 5-325 MG PO TABS: 1.0000 | ORAL_TABLET | ORAL | Status: DC | PRN

## 2014-02-18 NOTE — ED Notes (Signed)
Dr. Campos at bedside   

## 2014-02-18 NOTE — ED Notes (Signed)
Pt. On her feet working 8 hour days, noticed Wednesday that  [place]. Lower leg was swollen , blood red and bruising noted.  Denies any injury.  Pain increases with weight intermittent. Leg is red and warm to touch and swollen.  Bruising has improved

## 2014-02-18 NOTE — ED Provider Notes (Signed)
CSN: [id number]     Arrival date & time 02/18/14  1030 History   First MD Initiated Contact with Patient 02/18/14 1110     Chief Complaint  Patient presents with  . Leg Swelling     HPI Patient presents with left ankle pain.  She states 3 days ago she developed redness and swelling of her left distal tibia and proximal ankle.  She's never had gout before.  She denies recent trauma or injury.  She states since then she's been elevating her left leg and applying ice has had significant improvement in the redness and swelling.  She states the pain continues but is definitely improving as well.  She finally presented emergency department today because family urged her to come to the ER.  Family was concerned about the possibility of a blood clot.  Patient has no history of DVT or PE.  Patient has no recent travel or surgery.  No recent injury to her left ankle.  Denies numbness or tingling in her left foot.   Past Medical History  Diagnosis Date  . Heart palpitations   . Heart murmur   . Hypertension   . High cholesterol    Past Surgical History  Procedure Laterality Date  . Tubal ligation     Family History  Problem Relation Age of Onset  . Diabetes Mother   . Hypertension Mother    History  Substance Use Topics  . Smoking status: Never Smoker   . Smokeless tobacco: Not on file  . Alcohol Use: No   OB History   Grav Para Term Preterm Abortions TAB SAB Ect Mult Living                 Review of Systems  All other systems reviewed and are negative.     Allergies  Codeine  Home Medications   Prior to Admission medications   Medication Sig Start Date End Date Taking? Authorizing Provider  amLODipine (NORVASC) 2.5 MG tablet Take 2.5 mg by mouth daily.   Yes Historical Provider, MD  hydrochlorothiazide (HYDRODIURIL) 25 MG tablet Take 25 mg by mouth daily.   Yes Historical Provider, MD  Multiple Vitamin (MULTIVITAMIN WITH MINERALS) TABS Take 1 tablet by mouth daily.   Yes  Historical Provider, MD  Omega-3 Fatty Acids (FISH OIL) 1000 MG CAPS Take 2 capsules by mouth daily.   Yes Historical Provider, MD  Tetrahydrozoline HCl (EYE DROPS OP) Place 2 drops into both eyes at bedtime.   Yes Historical Provider, MD  traMADol (ULTRAM) 50 MG tablet Take 50 mg by mouth every 6 (six) hours as needed for moderate pain.   Yes Historical Provider, MD  vitamin B-12 (CYANOCOBALAMIN) 1000 MCG tablet Take 1,000 mcg by mouth daily.   Yes Historical Provider, MD  HYDROcodone-acetaminophen (NORCO/VICODIN) 5-325 MG per tablet Take 1 tablet by mouth every 4 (four) hours as needed for moderate pain. 02/18/14   Hoy Morn, MD   BP 120/82  Pulse 74  Temp(Src) 98.2 F (36.8 C) (Oral)  Ht 5\' 2"  (1.575 m)  Wt 162 lb (73.483 kg)  BMI 29.62 kg/m2  SpO2 99%  LMP 12/29/2012 Physical Exam  Nursing note and vitals reviewed. Constitutional: She is oriented to person, place, and time. She appears well-developed and well-nourished.  HENT:  Head: Normocephalic.  Eyes: EOM are normal.  Neck: Normal range of motion.  Pulmonary/Chest: Effort normal.  Abdominal: She exhibits no distension.  Musculoskeletal: Normal range of motion.  Normal PT and DP  pulse in left foot.  No significant swelling of the left proximal ankle left distal tibia noted.  Mild discoloration.  No warmth.  No focal tenderness.  Pain with range of motion of left ankle.  Neurological: She is alert and oriented to person, place, and time.  Psychiatric: She has a normal mood and affect.    ED Course  Procedures (including critical care time) Labs Review Labs Reviewed - No data to display  Imaging Review No results found.   EKG Interpretation None      MDM   Final diagnoses:  Left ankle pain   Doubt septic arthritis.  Doubt cellulitis.  The patient's redness and pain and swelling seem to be improving with symptomatic management with elevation and ice.  At this point I will hold antibiotics.  She understands  return to the ER for new or worsening symptoms.  No risk factors for DVT.  No proximal leg pain or swelling.  No family history of DVT or PE.  This may represent gout of her left ankle that is improving with symptomatic management.  Patient be prescribed a short course of pain medicine.    Hoy Morn, MD 02/18/14 1149

## 2014-03-10 ENCOUNTER — Emergency Department (HOSPITAL_COMMUNITY)
Admission: EM | Admit: 2014-03-10 | Discharge: 2014-03-10 | Disposition: A | Discharge status: Home / Self Care | Payer: BC Managed Care – PPO | Attending: Emergency Medicine | Admitting: Emergency Medicine

## 2014-03-10 ENCOUNTER — Encounter (HOSPITAL_COMMUNITY): Payer: Self-pay | Admitting: Emergency Medicine

## 2014-03-10 DIAGNOSIS — R109 Unspecified abdominal pain: Secondary | ICD-10-CM | POA: Insufficient documentation

## 2014-03-10 DIAGNOSIS — Z79899 Other long term (current) drug therapy: Secondary | ICD-10-CM | POA: Insufficient documentation

## 2014-03-10 DIAGNOSIS — I10 Essential (primary) hypertension: Secondary | ICD-10-CM | POA: Insufficient documentation

## 2014-03-10 DIAGNOSIS — Z862 Personal history of diseases of the blood and blood-forming organs and certain disorders involving the immune mechanism: Secondary | ICD-10-CM | POA: Insufficient documentation

## 2014-03-10 DIAGNOSIS — R197 Diarrhea, unspecified: Secondary | ICD-10-CM | POA: Insufficient documentation

## 2014-03-10 DIAGNOSIS — R112 Nausea with vomiting, unspecified: Secondary | ICD-10-CM | POA: Insufficient documentation

## 2014-03-10 DIAGNOSIS — Z8639 Personal history of other endocrine, nutritional and metabolic disease: Secondary | ICD-10-CM | POA: Insufficient documentation

## 2014-03-10 DIAGNOSIS — R011 Cardiac murmur, unspecified: Secondary | ICD-10-CM | POA: Insufficient documentation

## 2014-03-10 LAB — COMPREHENSIVE METABOLIC PANEL
ALK PHOS: 50 U/L (ref 39–117)
ALT: 15 U/L (ref 0–35)
ANION GAP: 16 — AB (ref 5–15)
AST: 29 U/L (ref 0–37)
Albumin: 3.8 g/dL (ref 3.5–5.2)
BILIRUBIN TOTAL: 0.6 mg/dL (ref 0.3–1.2)
BUN: 17 mg/dL (ref 6–23)
CO2: 22 mEq/L (ref 19–32)
CREATININE: 0.96 mg/dL (ref 0.50–1.10)
Calcium: 9.4 mg/dL (ref 8.4–10.5)
Chloride: 101 mEq/L (ref 96–112)
GFR calc Af Amer: 76 mL/min — ABNORMAL LOW (ref >90)
GFR calc non Af Amer: 65 mL/min — ABNORMAL LOW (ref >90)
GLUCOSE: 107 mg/dL — AB (ref 70–99)
POTASSIUM: 4 meq/L (ref 3.7–5.3)
Sodium: 139 mEq/L (ref 137–147)
TOTAL PROTEIN: 7.3 g/dL (ref 6.0–8.3)

## 2014-03-10 LAB — URINALYSIS, ROUTINE W REFLEX MICROSCOPIC
Bilirubin Urine: NEGATIVE
Glucose, UA: NEGATIVE mg/dL
HGB URINE DIPSTICK: NEGATIVE
Ketones, ur: NEGATIVE mg/dL
Leukocytes, UA: NEGATIVE
NITRITE: NEGATIVE
PROTEIN: NEGATIVE mg/dL
Specific Gravity, Urine: 1.017 (ref 1.005–1.030)
Urobilinogen, UA: 0.2 mg/dL (ref 0.0–1.0)
pH: 5.5 (ref 5.0–8.0)

## 2014-03-10 LAB — CBC WITH DIFFERENTIAL/PLATELET
Basophils Absolute: 0 10*3/uL (ref 0.0–0.1)
Basophils Relative: 1 % (ref 0–1)
EOS ABS: 0.1 10*3/uL (ref 0.0–0.7)
EOS PCT: 2 % (ref 0–5)
HEMATOCRIT: 44.2 % (ref 36.0–46.0)
HEMOGLOBIN: 15.4 g/dL — AB (ref 12.0–15.0)
LYMPHS ABS: 1.4 10*3/uL (ref 0.7–4.0)
Lymphocytes Relative: 27 % (ref 12–46)
MCH: 31.6 pg (ref 26.0–34.0)
MCHC: 34.8 g/dL (ref 30.0–36.0)
MCV: 90.6 fL (ref 78.0–100.0)
MONO ABS: 0.3 10*3/uL (ref 0.1–1.0)
MONOS PCT: 5 % (ref 3–12)
NEUTROS PCT: 65 % (ref 43–77)
Neutro Abs: 3.3 10*3/uL (ref 1.7–7.7)
Platelets: 239 10*3/uL (ref 150–400)
RBC: 4.88 MIL/uL (ref 3.87–5.11)
RDW: 12.8 % (ref 11.5–15.5)
WBC: 5.1 10*3/uL (ref 4.0–10.5)

## 2014-03-10 LAB — LIPASE, BLOOD: LIPASE: 29 U/L (ref 11–59)

## 2014-03-10 MED ORDER — SODIUM CHLORIDE 0.9 % IV BOLUS (SEPSIS)
1000.0000 mL | Freq: Once | INTRAVENOUS | Status: AC
  Administered 2014-03-10: 1000 mL via INTRAVENOUS

## 2014-03-10 MED ORDER — ONDANSETRON HCL 4 MG/2ML IJ SOLN
4.0000 mg | Freq: Once | INTRAMUSCULAR | Status: AC
  Administered 2014-03-10: 4 mg via INTRAVENOUS
  Filled 2014-03-10: qty 2

## 2014-03-10 MED ORDER — ONDANSETRON 4 MG PO TBDP: 4.0000 mg | ORAL_TABLET | Freq: Three times a day (TID) | ORAL | Status: DC | PRN

## 2014-03-10 NOTE — Discharge Instructions (Signed)
Follow up with your primary care doctor about your hospital visit. Continue to hydrate orally.Take all medications as prescribed & use Zofran as directed for nausea & vomiting.  Read the instructions below for reasons to return to the ER.  ° °The 'BRAT' diet is suggested, then progress to diet as tolerated as symptoms abate. Call if bloody stools, persistent diarrhea, vomiting, fever or abdominal pain. °Bananas.  °Rice.  °Applesauce.  °Toast (and other simple starches such as crackers, potatoes, noodles).  ° °SEEK IMMEDIATE MEDICAL ATTENTION IF: ° °You begin having localized abdominal pain that does not go away or becomes severe (The right side could  possibly be appendicitis. In an adult, the left lower portion of the abdomen could be colitis or diverticulitis) °  °A temperature above 101 develops ° °Repeated vomiting occurs (multiple uncontrollable episodes) or you are unable to keep fluids down ° °Blood is being passed in stools or vomit (bright red or black tarry stools).  ° °Return also if you develop chest pain, difficulty breathing, dizziness or fainting, or become confused, poorly responsive, or inconsolable (young children). ° ° °RESOURCE GUIDE ° °Dental Problems ° °Patients with Medicaid: °[facility]                     [facility] °[address]                                           [address] °Phone:  [phone]                                                  Phone:  [phone] ° °If unable to pay or uninsured, contact:  [facility] or [facility] to become qualified for the adult dental clinic. ° °Chronic Pain Problems °Contact [facility]  [phone] °Patients need to be referred by their primary care doctor. ° °Insufficient Money for Medicine °Contact [employer]:  call "211" or [facility] 271-5999. ° °No Primary Care Doctor °Call [facility]  832-8000 °Other agencies that provide inexpensive medical care °   [facility]  [phone] °   [facility]  [phone] °   [facility]  [phone] °   [facility]  [phone] °   [facility]  [phone] °   [facility]  [phone] ° °Psychological Services °[facility]  [phone] °[facility]  [phone] °[facility]   [phone] (emergency services [phone]) ° °Substance Abuse Resources °[facility]  336-882-2125 °[facility] 336-784-9470 °[facility] 336-285-9073 °[facility] 336-845-3988 °Residential & Outpatient Substance Abuse Program  800-659-3381 ° °Abuse/Neglect °[place] [facility] (336) 641-3795 °[place] [facility] Hot[facility] 800-378-5315 (After Hours) ° °Emergency Shelter °[facility] (336) 271-5985 ° °Maternity Homes °[facility] (336) 275-9566 °[facility] (704) 372-4663 ° °MRSA Hotline #:   832-7006 ° ° ° °[place] [facility] ° °[facility] [place] [facility]     [employer]                            [facility] [address] [place]                       [address]      [address]  [place]                                                [place]                            [place] Phone:  [phone]                                   Phone:  [phone]                 Phone:  [phone]  [facility] Phone:  [phone]  [place] Child Abuse Hotline (336) 342-1394 (336) 342-3537 (After Hours)    

## 2014-03-10 NOTE — ED Provider Notes (Signed)
CSN: [id number]     Arrival date & time 03/10/14  6073 History   First MD Initiated Contact with Patient 03/10/14 [phone]     Chief Complaint  Patient presents with  . Vomiting  . Diarrhea  . Abdominal Pain     (Consider location/radiation/quality/duration/timing/severity/associated sxs/prior Treatment) HPI Comments: Patient presenting with a chief complaint of nausea, vomiting, and diarrhea.  She reports that symptoms began 4 days and are gradually worsening.  She reports that she is also having some associated diffuse abdominal cramping, which she reports feels like menstrual cramps.  She reports numerous episodes of vomiting and diarrhea daily.  She has taken OTC antidiarrheal medication with no improvement.  She denies any blood in her emesis or blood in her stool.  She denies urinary symptoms, vaginal bleeding, vaginal discharge, fever, or chills.  No prior abdominal surgeries.  No recent travel or hospitalizations.  No recent antibiotic use.  No known sick exposure.  Patient is a 58 y.o. female presenting with diarrhea and abdominal pain. The history is provided by the patient.  Diarrhea Associated symptoms: abdominal pain   Abdominal Pain Associated symptoms: diarrhea     Past Medical History  Diagnosis Date  . Heart palpitations   . Heart murmur   . Hypertension   . High cholesterol    Past Surgical History  Procedure Laterality Date  . Tubal ligation     Family History  Problem Relation Age of Onset  . Diabetes Mother   . Hypertension Mother    History  Substance Use Topics  . Smoking status: Never Smoker   . Smokeless tobacco: Not on file  . Alcohol Use: No   OB History   Grav Para Term Preterm Abortions TAB SAB Ect Mult Living                 Review of Systems  Gastrointestinal: Positive for abdominal pain and diarrhea.  All other systems reviewed and are negative.     Allergies  Codeine; Lisinopril; and Lovastatin  Home Medications   Prior to  Admission medications   Medication Sig Start Date End Date Taking? Authorizing Provider  amLODipine (NORVASC) 2.5 MG tablet Take 2.5 mg by mouth daily.    Historical Provider, MD  hydrochlorothiazide (HYDRODIURIL) 25 MG tablet Take 25 mg by mouth daily.    Historical Provider, MD  HYDROcodone-acetaminophen (NORCO/VICODIN) 5-325 MG per tablet Take 1 tablet by mouth every 4 (four) hours as needed for moderate pain. 02/18/14   Hoy Morn, MD  Multiple Vitamin (MULTIVITAMIN WITH MINERALS) TABS Take 1 tablet by mouth daily.    Historical Provider, MD  Omega-3 Fatty Acids (FISH OIL) 1000 MG CAPS Take 2 capsules by mouth daily.    Historical Provider, MD  Tetrahydrozoline HCl (EYE DROPS OP) Place 2 drops into both eyes at bedtime.    Historical Provider, MD  traMADol (ULTRAM) 50 MG tablet Take 50 mg by mouth every 6 (six) hours as needed for moderate pain.    Historical Provider, MD  vitamin B-12 (CYANOCOBALAMIN) 1000 MCG tablet Take 1,000 mcg by mouth daily.    Historical Provider, MD   BP 127/87  Pulse 87  Temp(Src) 97.9 F (36.6 C) (Oral)  Resp 16  SpO2 98%  LMP 12/29/2012 Physical Exam  Nursing note and vitals reviewed. Constitutional: She appears well-developed and well-nourished.  HENT:  Head: Normocephalic and atraumatic.  Mouth/Throat: Oropharynx is clear and moist.  Neck: Normal range of motion. Neck supple.  Cardiovascular: Normal rate,  regular rhythm and normal heart sounds.   Pulmonary/Chest: Effort normal and breath sounds normal.  Abdominal: Soft. Bowel sounds are normal. She exhibits no distension and no mass. There is no rebound and no guarding.  Mild diffuse tenderness to palpation  Musculoskeletal: Normal range of motion.  Neurological: She is alert.  Skin: Skin is warm and dry.  Psychiatric: She has a normal mood and affect.    ED Course  Procedures (including critical care time) Labs Review Labs Reviewed  CBC WITH DIFFERENTIAL  COMPREHENSIVE METABOLIC PANEL   LIPASE, BLOOD  URINALYSIS, ROUTINE W REFLEX MICROSCOPIC    Imaging Review No results found.   EKG Interpretation None     10:46 AM Reassessed patient.  She reports that her nausea and abdominal cramping have improved.  Abdomen:  Nondistended.  Soft and nontender.   11:59 AM Reassessed patient.  Patient tolerating PO liquids.  Abdomen:  Soft and nontender.   MDM   Final diagnoses:  None   Patient presents today with nausea, vomiting, and diarrhea.  Patient also with associated abdominal "cramping."  No episodes of emesis or diarrhea during ED course. Nausea and abdominal cramping improved in the ED after given IV Zofran.  No localized abdominal tenderness on exam.  No rebound or guarding.  Patient afebrile.  Labs unremarkable, no leukocytosis.  Therefore, do not feel that imaging is indicated at this time.  Repeat abdominal exam patient was nontender to palpation of the abdomen.  Patient with no prior abdominal surgeries.  Therefore, feel that SBO is unlikely.  Patient tolerating PO liquids prior to discharge.  Feel that the patient is stable for discharge.  Return precautions given.    Hyman Bible, PA-C 03/10/14 1244

## 2014-03-10 NOTE — ED Provider Notes (Signed)
  This was a shared visit with a mid-level provided (NP or PA).  Throughout the patient's course I was available for consultation/collaboration.  I saw the ECG (if appropriate), relevant labs and studies - I agree with the interpretation.  On my exam the patient was in no distress.  She had substantial improvement from her initial presentation, and was in no distress, with unremarkable labs, no abdominal pain.      Carmin Muskrat, MD 03/10/14 [phone]

## 2014-03-10 NOTE — ED Notes (Signed)
Patient stated her stomach gets upset after taking a drink

## 2014-03-10 NOTE — ED Notes (Signed)
Pt c/o N/V/D x 2 days with abd cramping

## 2014-03-23 ENCOUNTER — Emergency Department (HOSPITAL_COMMUNITY)
Admission: EM | Admit: 2014-03-23 | Discharge: 2014-03-23 | Disposition: A | Discharge status: Home / Self Care | Payer: Self-pay | Attending: Emergency Medicine | Admitting: Emergency Medicine

## 2014-03-23 ENCOUNTER — Emergency Department (HOSPITAL_COMMUNITY): Payer: BC Managed Care – PPO

## 2014-03-23 ENCOUNTER — Encounter (HOSPITAL_COMMUNITY): Payer: Self-pay | Admitting: Emergency Medicine

## 2014-03-23 DIAGNOSIS — R011 Cardiac murmur, unspecified: Secondary | ICD-10-CM | POA: Insufficient documentation

## 2014-03-23 DIAGNOSIS — Y9389 Activity, other specified: Secondary | ICD-10-CM | POA: Insufficient documentation

## 2014-03-23 DIAGNOSIS — S39012A Strain of muscle, fascia and tendon of lower back, initial encounter: Secondary | ICD-10-CM

## 2014-03-23 DIAGNOSIS — S335XXA Sprain of ligaments of lumbar spine, initial encounter: Secondary | ICD-10-CM | POA: Insufficient documentation

## 2014-03-23 DIAGNOSIS — IMO0002 Reserved for concepts with insufficient information to code with codable children: Secondary | ICD-10-CM | POA: Insufficient documentation

## 2014-03-23 DIAGNOSIS — Z3202 Encounter for pregnancy test, result negative: Secondary | ICD-10-CM | POA: Insufficient documentation

## 2014-03-23 DIAGNOSIS — Z79899 Other long term (current) drug therapy: Secondary | ICD-10-CM | POA: Insufficient documentation

## 2014-03-23 DIAGNOSIS — X500XXA Overexertion from strenuous movement or load, initial encounter: Secondary | ICD-10-CM | POA: Insufficient documentation

## 2014-03-23 DIAGNOSIS — Z862 Personal history of diseases of the blood and blood-forming organs and certain disorders involving the immune mechanism: Secondary | ICD-10-CM | POA: Insufficient documentation

## 2014-03-23 DIAGNOSIS — I10 Essential (primary) hypertension: Secondary | ICD-10-CM | POA: Insufficient documentation

## 2014-03-23 DIAGNOSIS — Y9289 Other specified places as the place of occurrence of the external cause: Secondary | ICD-10-CM | POA: Insufficient documentation

## 2014-03-23 DIAGNOSIS — Z8639 Personal history of other endocrine, nutritional and metabolic disease: Secondary | ICD-10-CM | POA: Insufficient documentation

## 2014-03-23 LAB — URINALYSIS, ROUTINE W REFLEX MICROSCOPIC
Bilirubin Urine: NEGATIVE
GLUCOSE, UA: NEGATIVE mg/dL
Hgb urine dipstick: NEGATIVE
KETONES UR: NEGATIVE mg/dL
Leukocytes, UA: NEGATIVE
Nitrite: NEGATIVE
PROTEIN: NEGATIVE mg/dL
Specific Gravity, Urine: 1.024 (ref 1.005–1.030)
Urobilinogen, UA: 0.2 mg/dL (ref 0.0–1.0)
pH: 5.5 (ref 5.0–8.0)

## 2014-03-23 LAB — POC URINE PREG, ED: Preg Test, Ur: NEGATIVE

## 2014-03-23 MED ORDER — CYCLOBENZAPRINE HCL 10 MG PO TABS: 10.0000 mg | ORAL_TABLET | Freq: Two times a day (BID) | ORAL | Status: DC | PRN

## 2014-03-23 MED ORDER — DIAZEPAM 5 MG PO TABS
5.0000 mg | ORAL_TABLET | Freq: Once | ORAL | Status: AC
  Administered 2014-03-23: 5 mg via ORAL
  Filled 2014-03-23: qty 1

## 2014-03-23 MED ORDER — HYDROMORPHONE HCL PF 1 MG/ML IJ SOLN
1.0000 mg | Freq: Once | INTRAMUSCULAR | Status: AC
  Administered 2014-03-23: 1 mg via INTRAVENOUS
  Filled 2014-03-23: qty 1

## 2014-03-23 MED ORDER — KETOROLAC TROMETHAMINE 60 MG/2ML IM SOLN
60.0000 mg | Freq: Once | INTRAMUSCULAR | Status: AC
  Administered 2014-03-23: 60 mg via INTRAMUSCULAR
  Filled 2014-03-23: qty 2

## 2014-03-23 NOTE — ED Notes (Signed)
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  Pt. Having rt. Mid/upper back pain.  Pain will radiate into her rt. Groin area intermittent numbness down her rt. Leg.  Pain increases with movement.

## 2014-03-23 NOTE — ED Notes (Signed)
Pt presents with lower back spasms to right lower back for the past week- pt has been taking Flexeril at home without relief.  Pt also admits to odor to her urine and right flank pain since last Thursday.  Denies vaginal discharge or bleeding.  Denies any bowel or bladder incontinence.

## 2014-03-23 NOTE — ED Provider Notes (Signed)
CSN: [id number]     Arrival date & time 03/23/14  1457 History   First MD Initiated Contact with Patient 03/23/14 1724     Chief Complaint  Patient presents with  . Back Pain    Patient is a 58 y.o. female presenting with back pain. The history is provided by the patient.  Back Pain Location:  Lumbar spine Quality: sharp, spasm. Radiates to:  R thigh (to RLQ) Pain severity:  Severe Onset quality:  Gradual Duration:  1 week Timing:  Intermittent Progression:  Waxing and waning Chronicity:  New Context: twisting   Context: not falling and not lifting heavy objects   Context comment:  Noticed after sweeping broom. No fall or trauma Relieved by:  Being still Worsened by:  Movement, standing, twisting, sitting and bending Ineffective treatments: flexeril. Associated symptoms: tingling (chronic b/l lower)   Associated symptoms: no abdominal swelling, no bladder incontinence, no bowel incontinence, no dysuria, no fever, no numbness, no pelvic pain, no perianal numbness, no weakness and no weight loss   Risk factors: menopause   Risk factors: no hx of cancer, not pregnant and no steroid use     Past Medical History  Diagnosis Date  . Heart palpitations   . Heart murmur   . Hypertension   . High cholesterol    Past Surgical History  Procedure Laterality Date  . Tubal ligation     Family History  Problem Relation Age of Onset  . Diabetes Mother   . Hypertension Mother    History  Substance Use Topics  . Smoking status: Never Smoker   . Smokeless tobacco: Not on file  . Alcohol Use: No   OB History   Grav Para Term Preterm Abortions TAB SAB Ect Mult Living                 Review of Systems  Constitutional: Negative for fever and weight loss.  Gastrointestinal: Negative for bowel incontinence.  Genitourinary: Negative for bladder incontinence, dysuria and pelvic pain.  Musculoskeletal: Positive for back pain.  Neurological: Positive for tingling (chronic b/l lower).  Negative for weakness and numbness.    Allergies  Codeine; Lisinopril; and Lovastatin  Home Medications   Prior to Admission medications   Medication Sig Start Date End Date Taking? Authorizing Provider  amLODipine (NORVASC) 2.5 MG tablet Take 2.5 mg by mouth daily.    Historical Provider, MD  cyclobenzaprine (FLEXERIL) 10 MG tablet Take 10 mg by mouth at bedtime as needed. For muscle spasms 12/22/12   Historical Provider, MD  hydrochlorothiazide (HYDRODIURIL) 25 MG tablet Take 25 mg by mouth daily.    Historical Provider, MD  HYDROcodone-acetaminophen (NORCO/VICODIN) 5-325 MG per tablet Take 1 tablet by mouth every 4 (four) hours as needed for moderate pain. 02/18/14   Hoy Morn, MD  Multiple Vitamin (MULTIVITAMIN WITH MINERALS) TABS Take 1 tablet by mouth daily.    Historical Provider, MD  neomycin-bacitracin-polymyxin (NEOSPORIN) ointment Apply 1 application topically as needed for wound care.    Historical Provider, MD  Omega-3 Fatty Acids (FISH OIL) 1000 MG CAPS Take 1,000 mg by mouth daily.     Historical Provider, MD  ondansetron (ZOFRAN ODT) 4 MG disintegrating tablet Take 1 tablet (4 mg total) by mouth every 8 (eight) hours as needed for nausea or vomiting. 03/10/14   Hyman Bible, PA-C  Tetrahydrozoline HCl (EYE DROPS OP) Place 1 drop into both eyes at bedtime. Bausch and Laum Dry Eyes    Historical Provider, MD  vitamin B-12 (CYANOCOBALAMIN) 1000 MCG tablet Take 1,000 mcg by mouth daily.    Historical Provider, MD   BP 127/85  Pulse 118  Temp(Src) 98.6 F (37 C) (Oral)  Resp 18  SpO2 97%  LMP 12/29/2012 Physical Exam  Nursing note and vitals reviewed. Constitutional: She is oriented to person, place, and time. She appears well-developed and well-nourished. No distress.  HENT:  Head: Normocephalic and atraumatic.  Nose: Nose normal.  Eyes: Conjunctivae are normal.  Neck: Normal range of motion. Neck supple. No tracheal deviation present.  Cardiovascular: Normal rate,  regular rhythm and normal heart sounds.   No murmur heard. Pulmonary/Chest: Effort normal and breath sounds normal. No respiratory distress. She has no rales.  Abdominal: Soft. Bowel sounds are normal. She exhibits no distension and no mass. There is no tenderness.  Musculoskeletal: Normal range of motion. She exhibits no edema.  Midline lower lumbar TTP with paraspinous TTP with spasm appreciated Negative SLR  Neurological: She is alert and oriented to person, place, and time. She has normal reflexes. She displays normal reflexes. No cranial nerve deficit. She exhibits normal muscle tone. Coordination normal.  Normal gait. Toes flexor.  Skin: Skin is warm and dry. No rash noted.  Psychiatric: She has a normal mood and affect.    ED Course  Procedures (including critical care time) Labs Review Labs Reviewed  URINALYSIS, ROUTINE W REFLEX MICROSCOPIC  POC URINE PREG, ED    Imaging Review Dg Lumbar Spine 2-3 Views  03/23/2014   CLINICAL DATA:  Low back pain  EXAM: LUMBAR SPINE - 2-3 VIEW  COMPARISON:  None.  FINDINGS: Five lumbar type vertebral bodies.  Normal lumbar lordosis.  No evidence of fracture dislocation. Vertebral body heights are maintained.  Suspected bilateral pars defects at L5-S1 with grade 1 spondylolisthesis.  Visualized bony pelvis appears intact.  Calcified uterine fibroid.  IMPRESSION: No fracture or dislocation is seen.  Grade 1 spondylolisthesis at L5-S1.   Electronically Signed   By: Julian Hy M.D.   On: 03/23/2014 20:23     EKG Interpretation None      MDM   Final diagnoses:  Lumbar strain, initial encounter   Medications  diazepam (VALIUM) tablet 5 mg (5 mg Oral Given 03/23/14 1819)  ketorolac (TORADOL) injection 60 mg (60 mg Intramuscular Given 03/23/14 1819)  HYDROmorphone (DILAUDID) injection 1 mg (1 mg Intravenous Given 03/23/14 1952)     No red flags.  Obvious spasm on exam to R lumbar paraspinous. No traumatic injury.  Plain film negative.   N/v intact.  No suspicion for cord compression/ CES.  Afebrile, no IVDU, doubt epidural abscess. UA neg.  Symptomatically treated with improvement in sx.   8:59 PM Pain improved. Ambulates without assistance.  Not weak in legs only has pain getting out of bed.   Strict return precautions given, d/c with flexeril.   Tammy Sours, MD 03/24/14 [phone]

## 2014-03-23 NOTE — ED Notes (Signed)
Reported to Resident that pt.s pain is unchanged after receiving pain medication

## 2014-03-25 NOTE — ED Provider Notes (Signed)
I have personally seen and examined the patient.  I have discussed the plan of care with the resident.  I have reviewed the documentation on PMH/FH/Soc. History.  I have reviewed the documentation of the resident and agree.  Pt well appearing, no distress, improved in the ED Stable for d/c home  Sharyon Cable, MD 03/25/14 0020

## 2014-05-03 ENCOUNTER — Emergency Department (HOSPITAL_COMMUNITY)
Admission: EM | Admit: 2014-05-03 | Discharge: 2014-05-03 | Disposition: A | Discharge status: Home / Self Care | Payer: BC Managed Care – PPO | Attending: Emergency Medicine | Admitting: Emergency Medicine

## 2014-05-03 ENCOUNTER — Emergency Department (HOSPITAL_COMMUNITY): Payer: BC Managed Care – PPO

## 2014-05-03 ENCOUNTER — Encounter (HOSPITAL_COMMUNITY): Payer: Self-pay | Admitting: Emergency Medicine

## 2014-05-03 DIAGNOSIS — Z79899 Other long term (current) drug therapy: Secondary | ICD-10-CM | POA: Insufficient documentation

## 2014-05-03 DIAGNOSIS — Z8669 Personal history of other diseases of the nervous system and sense organs: Secondary | ICD-10-CM | POA: Insufficient documentation

## 2014-05-03 DIAGNOSIS — Y9389 Activity, other specified: Secondary | ICD-10-CM | POA: Insufficient documentation

## 2014-05-03 DIAGNOSIS — X58XXXA Exposure to other specified factors, initial encounter: Secondary | ICD-10-CM | POA: Insufficient documentation

## 2014-05-03 DIAGNOSIS — I10 Essential (primary) hypertension: Secondary | ICD-10-CM | POA: Insufficient documentation

## 2014-05-03 DIAGNOSIS — R011 Cardiac murmur, unspecified: Secondary | ICD-10-CM | POA: Insufficient documentation

## 2014-05-03 DIAGNOSIS — Z8639 Personal history of other endocrine, nutritional and metabolic disease: Secondary | ICD-10-CM | POA: Insufficient documentation

## 2014-05-03 DIAGNOSIS — Y9269 Other specified industrial and construction area as the place of occurrence of the external cause: Secondary | ICD-10-CM | POA: Insufficient documentation

## 2014-05-03 DIAGNOSIS — Y990 Civilian activity done for income or pay: Secondary | ICD-10-CM | POA: Insufficient documentation

## 2014-05-03 DIAGNOSIS — S8392XA Sprain of unspecified site of left knee, initial encounter: Secondary | ICD-10-CM | POA: Insufficient documentation

## 2014-05-03 HISTORY — DX: Carpal tunnel syndrome, unspecified upper limb: G56.00

## 2014-05-03 MED ORDER — IBUPROFEN 200 MG PO TABS
400.0000 mg | ORAL_TABLET | Freq: Once | ORAL | Status: AC
  Administered 2014-05-03: 400 mg via ORAL
  Filled 2014-05-03: qty 2

## 2014-05-03 MED ORDER — MELOXICAM 7.5 MG PO TABS: ORAL_TABLET | ORAL | Status: DC

## 2014-05-03 MED ORDER — HYDROCODONE-ACETAMINOPHEN 5-325 MG PO TABS: 1.0000 | ORAL_TABLET | Freq: Four times a day (QID) | ORAL | Status: DC | PRN

## 2014-05-03 MED ORDER — HYDROCODONE-ACETAMINOPHEN 5-325 MG PO TABS
2.0000 | ORAL_TABLET | Freq: Once | ORAL | Status: AC
  Administered 2014-05-03: 2 via ORAL
  Filled 2014-05-03: qty 2

## 2014-05-03 NOTE — ED Provider Notes (Signed)
CSN: [id number]     Arrival date & time 05/03/14  1242 History  This chart was scribed for non-physician practitioner, Noland Fordyce, PA-C working with Charlesetta Shanks, MD by Frederich Balding, ED scribe. This patient was seen in room WTR6/WTR6 and the patient's care was started at 1:37 PM.   Chief Complaint  Patient presents with  . Knee Pain   The history is provided by the patient. No language interpreter was used.   HPI Comments: Brenda Peters is a 58 y.o. female who presents to the Emergency Department complaining of left knee pain that started last night after she twisted it at work. Pain started worsening last night when she got home from work and started spasming in her knee and calf. Bearing weight and certain movements worsen the pain. Pain is 10/10 at wort. Pt has taken flexeril with no relief.   Past Medical History  Diagnosis Date  . Heart palpitations   . Heart murmur   . Hypertension   . High cholesterol   . Carpal tunnel syndrome    Past Surgical History  Procedure Laterality Date  . Tubal ligation     Family History  Problem Relation Age of Onset  . Diabetes Mother   . Hypertension Mother    History  Substance Use Topics  . Smoking status: Never Smoker   . Smokeless tobacco: Not on file  . Alcohol Use: No   OB History   Grav Para Term Preterm Abortions TAB SAB Ect Mult Living                 Review of Systems  Musculoskeletal: Positive for arthralgias.  All other systems reviewed and are negative.  Allergies  Codeine; Lisinopril; and Lovastatin  Home Medications   Prior to Admission medications   Medication Sig Start Date End Date Taking? Authorizing Provider  cyclobenzaprine (FLEXERIL) 10 MG tablet Take 1 tablet (10 mg total) by mouth 2 (two) times daily as needed for muscle spasms. 03/23/14   Tammy Sours, MD  hydrochlorothiazide (HYDRODIURIL) 25 MG tablet Take 25 mg by mouth daily.    Historical Provider, MD  HYDROcodone-acetaminophen  (NORCO/VICODIN) 5-325 MG per tablet Take 1-2 tablets by mouth every 6 (six) hours as needed for moderate pain or severe pain. 05/03/14   Noland Fordyce, PA-C  meloxicam (MOBIC) 7.5 MG tablet Take 1-2 tabs daily for 5 days, then daily as needed for pain. 05/03/14   Noland Fordyce, PA-C  Multiple Vitamin (MULTIVITAMIN WITH MINERALS) TABS Take 1 tablet by mouth daily.    Historical Provider, MD  neomycin-bacitracin-polymyxin (NEOSPORIN) ointment Apply 1 application topically as needed for wound care.    Historical Provider, MD  Omega-3 Fatty Acids (FISH OIL) 1000 MG CAPS Take 1,000 mg by mouth daily.     Historical Provider, MD  Tetrahydrozoline HCl (EYE DROPS OP) Place 1 drop into both eyes at bedtime. Bausch and Laum Dry Eyes    Historical Provider, MD  vitamin B-12 (CYANOCOBALAMIN) 1000 MCG tablet Take 1,000 mcg by mouth daily.    Historical Provider, MD   BP 124/89  Pulse 88  Temp(Src) 97.8 F (36.6 C) (Oral)  Resp 16  SpO2 96%  LMP 12/29/2012  Physical Exam  Nursing note and vitals reviewed. Constitutional: She is oriented to person, place, and time. She appears well-developed and well-nourished.  HENT:  Head: Normocephalic and atraumatic.  Eyes: EOM are normal.  Neck: Normal range of motion.  Cardiovascular: Normal rate.   Pedal pulses 2+.  Pulmonary/Chest:  Effort normal.  Musculoskeletal: Normal range of motion.  Left knee with no deformity or edema. Tenderness along lateral aspect and lateral joint space. Tenderness with full flexion.  Neurological: She is alert and oriented to person, place, and time.  Sensation in tact. Antalgic gait.  Skin: Skin is warm and dry.  Psychiatric: She has a normal mood and affect. Her behavior is normal.    ED Course  Procedures (including critical care time)  DIAGNOSTIC STUDIES: Oxygen Saturation is 96% on RA, normal by my interpretation.    COORDINATION OF CARE: 1:40 PM-Discussed treatment plan which includes knee sleeve and pain  medication with pt at bedside and pt agreed to plan.   Labs Review Labs Reviewed - No data to display  Imaging Review Dg Knee Complete 4 Views Left  05/03/2014   CLINICAL DATA:  Twisting injury at work last night with persistent left knee pain.  EXAM: LEFT KNEE - COMPLETE 4+ VIEW  COMPARISON:  None.  FINDINGS: The bones of the knee are adequately mineralized. There is no acute fracture nor dislocation. There is minimal spurring from the superior and inferior articular surfaces of the patella. There is no joint effusion.  IMPRESSION: There is no acute bony abnormality of the left knee.   Electronically Signed   By: David  Martinique   On: 05/03/2014 13:32     EKG Interpretation None      MDM   Final diagnoses:  Left knee sprain, initial encounter    Pt c/o left knee pain after twisting it while at work last night. Pt is tender along lateral aspect. Neurovascularly in tact. Plain films: no acute abnormality. Will tx with knee sleeve & crutches provided, Rx: mobic and norco.  Pt states she has flerxil at home. Work note provided. Advised to f/u with PCP in 1 week for recheck of symptoms. Pt verbalized understanding and agreement with tx plan.   I personally performed the services described in this documentation, which was scribed in my presence. The recorded information has been reviewed and is accurate.  Noland Fordyce, PA-C 05/03/14 1426

## 2014-05-03 NOTE — ED Notes (Signed)
Pt c/o L knee pain after "twisting" it at work last night.  Pain score 10/10.  Pt reports taking several medications w/o relief.  Pt sts that it feels as if it is spasming.

## 2014-05-08 NOTE — ED Provider Notes (Signed)
Medical screening examination/treatment/procedure(s) were performed by non-physician practitioner and as supervising physician I was immediately available for consultation/collaboration.   EKG Interpretation None       Kela Baccari, MD 05/08/14 1518 

## 2014-09-04 ENCOUNTER — Encounter (HOSPITAL_COMMUNITY): Payer: Self-pay | Admitting: Emergency Medicine

## 2014-09-04 ENCOUNTER — Emergency Department (HOSPITAL_COMMUNITY)
Admission: EM | Admit: 2014-09-04 | Discharge: 2014-09-05 | Disposition: A | Discharge status: Home / Self Care | Payer: Self-pay | Attending: Emergency Medicine | Admitting: Emergency Medicine

## 2014-09-04 DIAGNOSIS — Z79899 Other long term (current) drug therapy: Secondary | ICD-10-CM | POA: Insufficient documentation

## 2014-09-04 DIAGNOSIS — J45909 Unspecified asthma, uncomplicated: Secondary | ICD-10-CM | POA: Insufficient documentation

## 2014-09-04 DIAGNOSIS — R011 Cardiac murmur, unspecified: Secondary | ICD-10-CM | POA: Insufficient documentation

## 2014-09-04 DIAGNOSIS — Z9851 Tubal ligation status: Secondary | ICD-10-CM | POA: Insufficient documentation

## 2014-09-04 DIAGNOSIS — R109 Unspecified abdominal pain: Secondary | ICD-10-CM

## 2014-09-04 DIAGNOSIS — R1032 Left lower quadrant pain: Secondary | ICD-10-CM | POA: Insufficient documentation

## 2014-09-04 DIAGNOSIS — R197 Diarrhea, unspecified: Secondary | ICD-10-CM | POA: Insufficient documentation

## 2014-09-04 DIAGNOSIS — Z8669 Personal history of other diseases of the nervous system and sense organs: Secondary | ICD-10-CM | POA: Insufficient documentation

## 2014-09-04 DIAGNOSIS — Z8639 Personal history of other endocrine, nutritional and metabolic disease: Secondary | ICD-10-CM | POA: Insufficient documentation

## 2014-09-04 DIAGNOSIS — Z791 Long term (current) use of non-steroidal anti-inflammatories (NSAID): Secondary | ICD-10-CM | POA: Insufficient documentation

## 2014-09-04 DIAGNOSIS — I10 Essential (primary) hypertension: Secondary | ICD-10-CM | POA: Insufficient documentation

## 2014-09-04 HISTORY — DX: Unspecified asthma, uncomplicated: J45.909

## 2014-09-04 LAB — COMPREHENSIVE METABOLIC PANEL
ALK PHOS: 56 U/L (ref 39–117)
ALT: 16 U/L (ref 0–35)
AST: 22 U/L (ref 0–37)
Albumin: 4.3 g/dL (ref 3.5–5.2)
Anion gap: 9 (ref 5–15)
BUN: 17 mg/dL (ref 6–23)
CO2: 25 mmol/L (ref 19–32)
Calcium: 9.6 mg/dL (ref 8.4–10.5)
Chloride: 103 mmol/L (ref 96–112)
Creatinine, Ser: 1.03 mg/dL (ref 0.50–1.10)
GFR calc Af Amer: 69 mL/min — ABNORMAL LOW (ref >90)
GFR calc non Af Amer: 60 mL/min — ABNORMAL LOW (ref >90)
Glucose, Bld: 101 mg/dL — ABNORMAL HIGH (ref 70–99)
POTASSIUM: 4.1 mmol/L (ref 3.5–5.1)
Sodium: 137 mmol/L (ref 135–145)
Total Bilirubin: 0.8 mg/dL (ref 0.3–1.2)
Total Protein: 7.6 g/dL (ref 6.0–8.3)

## 2014-09-04 LAB — CBC WITH DIFFERENTIAL/PLATELET
Basophils Absolute: 0 10*3/uL (ref 0.0–0.1)
Basophils Relative: 1 % (ref 0–1)
Eosinophils Absolute: 0.2 10*3/uL (ref 0.0–0.7)
Eosinophils Relative: 2 % (ref 0–5)
HCT: 43.2 % (ref 36.0–46.0)
Hemoglobin: 14.8 g/dL (ref 12.0–15.0)
Lymphocytes Relative: 25 % (ref 12–46)
Lymphs Abs: 1.7 10*3/uL (ref 0.7–4.0)
MCH: 30.8 pg (ref 26.0–34.0)
MCHC: 34.3 g/dL (ref 30.0–36.0)
MCV: 90 fL (ref 78.0–100.0)
Monocytes Absolute: 0.4 10*3/uL (ref 0.1–1.0)
Monocytes Relative: 5 % (ref 3–12)
NEUTROS ABS: 4.5 10*3/uL (ref 1.7–7.7)
Neutrophils Relative %: 67 % (ref 43–77)
PLATELETS: 242 10*3/uL (ref 150–400)
RBC: 4.8 MIL/uL (ref 3.87–5.11)
RDW: 13.2 % (ref 11.5–15.5)
WBC: 6.7 10*3/uL (ref 4.0–10.5)

## 2014-09-04 LAB — I-STAT TROPONIN, ED: Troponin i, poc: 0 ng/mL (ref 0.00–0.08)

## 2014-09-04 MED ORDER — IOHEXOL 300 MG/ML  SOLN
25.0000 mL | Freq: Once | INTRAMUSCULAR | Status: AC | PRN
  Administered 2014-09-04: 25 mL via ORAL

## 2014-09-04 MED ORDER — MORPHINE SULFATE 4 MG/ML IJ SOLN
4.0000 mg | Freq: Once | INTRAMUSCULAR | Status: AC
  Administered 2014-09-05: 4 mg via INTRAVENOUS
  Filled 2014-09-04: qty 1

## 2014-09-04 MED ORDER — SODIUM CHLORIDE 0.9 % IV BOLUS (SEPSIS)
1000.0000 mL | Freq: Once | INTRAVENOUS | Status: AC
  Administered 2014-09-04: 1000 mL via INTRAVENOUS

## 2014-09-04 NOTE — ED Provider Notes (Signed)
CSN: [id number]     Arrival date & time 09/04/14  1732 History   First MD Initiated Contact with Patient 09/04/14 2242     Chief Complaint  Patient presents with  . Abdominal Pain  . Diarrhea     (Consider location/radiation/quality/duration/timing/severity/associated sxs/prior Treatment) The history is provided by the patient and medical records. No language interpreter was used.     Brenda Peters is a 59 y.o. female  with a hx of HTN, heart murmur presents to the Emergency Department complaining of gradual, persistent, progressively worsening diarrhea onset 6 days ago.  t reports minimal oral intake due to lack of appetite.  Pt reports she was able to eat oatmeal and a whopper.  Pt reports her diarrhea is loosely formed, brown stool.  She reports 3-4 episodes each day.  Pt reports associated lower abd pain at the same time the diarrhea began.  Pt  Reports the pain is sharp, rated at a 8/10.  Pt reports her pain was a 10/10 earlier.  Pt denies hx of abd surgery, but has a BTL.  Nothing seems to make the symptoms better or worse.  Pt endorses subjective fever and chills at home throughout the week.  Pt denies headache, neck pain, chest pain, shortness of breath, vomiting, weakness, syncope, dysuria, hematuria..     Past Medical History  Diagnosis Date  . Heart palpitations   . Heart murmur   . Hypertension   . High cholesterol   . Carpal tunnel syndrome   . Asthma    Past Surgical History  Procedure Laterality Date  . Tubal ligation     Family History  Problem Relation Age of Onset  . Diabetes Mother   . Hypertension Mother    History  Substance Use Topics  . Smoking status: Never Smoker   . Smokeless tobacco: Not on file  . Alcohol Use: No   OB History    No data available     Review of Systems  Constitutional: Negative for fever, diaphoresis, appetite change, fatigue and unexpected weight change.  HENT: Negative for mouth sores and trouble swallowing.    Respiratory: Negative for cough, chest tightness, shortness of breath, wheezing and stridor.   Cardiovascular: Negative for chest pain and palpitations.  Gastrointestinal: Positive for abdominal pain and diarrhea. Negative for nausea, vomiting, constipation, blood in stool, abdominal distention and rectal pain.  Genitourinary: Negative for dysuria, urgency, frequency, hematuria, flank pain and difficulty urinating.  Musculoskeletal: Negative for back pain, neck pain and neck stiffness.  Skin: Negative for rash.  Neurological: Negative for weakness.  Hematological: Negative for adenopathy.  Psychiatric/Behavioral: Negative for confusion.  All other systems reviewed and are negative.     Allergies  Codeine; Lisinopril; and Lovastatin  Home Medications   Prior to Admission medications   Medication Sig Start Date End Date Taking? Authorizing Provider  cyclobenzaprine (FLEXERIL) 10 MG tablet Take 1 tablet (10 mg total) by mouth 2 (two) times daily as needed for muscle spasms. 03/23/14  Yes Tammy Sours, MD  hydrochlorothiazide (HYDRODIURIL) 25 MG tablet Take 25 mg by mouth daily.   Yes Historical Provider, MD  HYDROcodone-acetaminophen (NORCO/VICODIN) 5-325 MG per tablet Take 1-2 tablets by mouth every 6 (six) hours as needed for moderate pain or severe pain. 05/03/14  Yes Noland Fordyce, PA-C  meloxicam (MOBIC) 7.5 MG tablet Take 1-2 tabs daily for 5 days, then daily as needed for pain. 05/03/14  Yes Noland Fordyce, PA-C  Multiple Vitamin (MULTIVITAMIN WITH MINERALS) TABS Take  1 tablet by mouth daily.   Yes Historical Provider, MD  neomycin-bacitracin-polymyxin (NEOSPORIN) ointment Apply 1 application topically as needed for wound care.   Yes Historical Provider, MD  Omega-3 Fatty Acids (FISH OIL) 1000 MG CAPS Take 1,000 mg by mouth daily.    Yes Historical Provider, MD  Tetrahydrozoline HCl (EYE DROPS OP) Place 1 drop into both eyes at bedtime. Bausch and Laum Dry Eyes   Yes Historical  Provider, MD  vitamin B-12 (CYANOCOBALAMIN) 1000 MCG tablet Take 1,000 mcg by mouth daily.   Yes Historical Provider, MD   BP 135/83 mmHg  Pulse 78  Temp(Src) 98.3 F (36.8 C)  Resp 22  Ht 5\' 2"  (1.575 m)  Wt 150 lb (68.04 kg)  BMI 27.43 kg/m2  SpO2 99%  LMP 12/29/2012 Physical Exam  Constitutional: She appears well-developed and well-nourished. No distress.  Awake, alert, nontoxic appearance  HENT:  Head: Normocephalic and atraumatic.  Mouth/Throat: Oropharynx is clear and moist. No oropharyngeal exudate.  Eyes: Conjunctivae are normal. No scleral icterus.  Neck: Normal range of motion. Neck supple.  Cardiovascular: Normal rate, regular rhythm, normal heart sounds and intact distal pulses.   No murmur heard. Pulmonary/Chest: Effort normal and breath sounds normal. No respiratory distress. She has no wheezes.  Equal chest expansion  Abdominal: Soft. Bowel sounds are normal. She exhibits no distension and no mass. There is tenderness in the left lower quadrant. There is guarding. There is no rebound and no CVA tenderness.  Lower abdominal tenderness with guarding in the left lower quadrant No CVA tenderness No peritoneal signs  Musculoskeletal: Normal range of motion. She exhibits no edema.  Neurological: She is alert.  Speech is clear and goal oriented Moves extremities without ataxia  Skin: Skin is warm and dry. She is not diaphoretic.  Psychiatric: She has a normal mood and affect.  Nursing note and vitals reviewed.   ED Course  Procedures (including critical care time) Labs Review Labs Reviewed  COMPREHENSIVE METABOLIC PANEL - Abnormal; Notable for the following:    Glucose, Bld 101 (*)    GFR calc non Af Amer 60 (*)    GFR calc Af Amer 69 (*)    All other components within normal limits  CBC WITH DIFFERENTIAL/PLATELET  URINALYSIS, ROUTINE W REFLEX MICROSCOPIC  GI PATHOGEN PANEL BY PCR, STOOL  I-STAT TROPOININ, ED    Imaging Review No results found.   EKG  Interpretation   Date/Time:  Monday September 04 2014 22:33:09 EST Ventricular Rate:  68 PR Interval:  128 QRS Duration: 84 QT Interval:  405 QTC Calculation: 431 R Axis:   31 Text Interpretation:  Sinus rhythm Multiple ventricular premature  complexes Low voltage, precordial leads When compared with ECG of  01/16/2014, Premature ventricular complexes are now Present Confirmed by  [facility]  MD, DAVID ([id number]) on 09/04/2014 10:45:00 PM      MDM   Final diagnoses:  Abdominal pain, acute  Diarrhea  Abdominal pain, acute  Diarrhea   Carleta C Karp presents for approximately one week of lower abdominal pain, worse in the left lower quadrant with associated watery diarrhea. Patient denies melena, hematochezia or black tarry stools.  She denies alcoholism, NSAID usage or epigastric abdominal pain. Patient denies a history of diverticulitis.  Patient also denies bright red blood per rectum. Labs reassuring. Will obtain CT scan to rule out diverticulitis.  1:26 AM Patient continues to rest comfortably. Patient discussed with Baron Sane, PA-C who will follow CT abdomen pelvis and disposition accordingly.  Anticipate discharge home with antibiotics for diverticulitis.  Vitals stable.    BP 135/83 mmHg  Pulse 78  Temp(Src) 98.3 F (36.8 C)  Resp 22  Ht 5\' 2"  (1.575 m)  Wt 150 lb (68.04 kg)  BMI 27.43 kg/m2  SpO2 99%  LMP 12/29/2012   Jarrett Soho Ranisha Allaire, PA-C 09/05/14 0128  Julianne Rice, MD 09/06/14 [phone]

## 2014-09-04 NOTE — ED Notes (Signed)
Pt c/o diarrhea x 6 days and lower abd pain

## 2014-09-05 ENCOUNTER — Emergency Department (HOSPITAL_COMMUNITY): Payer: Self-pay

## 2014-09-05 ENCOUNTER — Encounter (HOSPITAL_COMMUNITY): Payer: Self-pay | Admitting: Radiology

## 2014-09-05 LAB — URINALYSIS, ROUTINE W REFLEX MICROSCOPIC
Bilirubin Urine: NEGATIVE
GLUCOSE, UA: NEGATIVE mg/dL
Hgb urine dipstick: NEGATIVE
KETONES UR: NEGATIVE mg/dL
LEUKOCYTES UA: NEGATIVE
Nitrite: NEGATIVE
Protein, ur: NEGATIVE mg/dL
Specific Gravity, Urine: 1.02 (ref 1.005–1.030)
Urobilinogen, UA: 0.2 mg/dL (ref 0.0–1.0)
pH: 5.5 (ref 5.0–8.0)

## 2014-09-05 MED ORDER — IOHEXOL 300 MG/ML  SOLN
100.0000 mL | Freq: Once | INTRAMUSCULAR | Status: AC | PRN
  Administered 2014-09-05: 100 mL via INTRAVENOUS

## 2014-09-05 MED ORDER — LOPERAMIDE HCL 2 MG PO CAPS: 2.0000 mg | ORAL_CAPSULE | Freq: Four times a day (QID) | ORAL | Status: DC | PRN

## 2014-09-05 NOTE — ED Provider Notes (Signed)
Patient care acquired from [facility], PA-C pending CT scan results.  Results for orders placed or performed during the hospital encounter of 09/04/14  CBC with Differential  Result Value Ref Range   WBC 6.7 4.0 - 10.5 K/uL   RBC 4.80 3.87 - 5.11 MIL/uL   Hemoglobin 14.8 12.0 - 15.0 g/dL   HCT 43.2 36.0 - 46.0 %   MCV 90.0 78.0 - 100.0 fL   MCH 30.8 26.0 - 34.0 pg   MCHC 34.3 30.0 - 36.0 g/dL   RDW 13.2 11.5 - 15.5 %   Platelets 242 150 - 400 K/uL   Neutrophils Relative % 67 43 - 77 %   Neutro Abs 4.5 1.7 - 7.7 K/uL   Lymphocytes Relative 25 12 - 46 %   Lymphs Abs 1.7 0.7 - 4.0 K/uL   Monocytes Relative 5 3 - 12 %   Monocytes Absolute 0.4 0.1 - 1.0 K/uL   Eosinophils Relative 2 0 - 5 %   Eosinophils Absolute 0.2 0.0 - 0.7 K/uL   Basophils Relative 1 0 - 1 %   Basophils Absolute 0.0 0.0 - 0.1 K/uL  Comprehensive metabolic panel  Result Value Ref Range   Sodium 137 135 - 145 mmol/L   Potassium 4.1 3.5 - 5.1 mmol/L   Chloride 103 96 - 112 mmol/L   CO2 25 19 - 32 mmol/L   Glucose, Bld 101 (H) 70 - 99 mg/dL   BUN 17 6 - 23 mg/dL   Creatinine, Ser 1.03 0.50 - 1.10 mg/dL   Calcium 9.6 8.4 - 10.5 mg/dL   Total Protein 7.6 6.0 - 8.3 g/dL   Albumin 4.3 3.5 - 5.2 g/dL   AST 22 0 - 37 U/L   ALT 16 0 - 35 U/L   Alkaline Phosphatase 56 39 - 117 U/L   Total Bilirubin 0.8 0.3 - 1.2 mg/dL   GFR calc non Af Amer 60 (L) >90 mL/min   GFR calc Af Amer 69 (L) >90 mL/min   Anion gap 9 5 - 15  Urinalysis, Routine w reflex microscopic  Result Value Ref Range   Color, Urine YELLOW YELLOW   APPearance CLEAR CLEAR   Specific Gravity, Urine 1.020 1.005 - 1.030   pH 5.5 5.0 - 8.0   Glucose, UA NEGATIVE NEGATIVE mg/dL   Hgb urine dipstick NEGATIVE NEGATIVE   Bilirubin Urine NEGATIVE NEGATIVE   Ketones, ur NEGATIVE NEGATIVE mg/dL   Protein, ur NEGATIVE NEGATIVE mg/dL   Urobilinogen, UA 0.2 0.0 - 1.0 mg/dL   Nitrite NEGATIVE NEGATIVE   Leukocytes, UA NEGATIVE NEGATIVE  I-Stat  Troponin, ED (not at [facility])  Result Value Ref Range   Troponin i, poc 0.00 0.00 - 0.08 ng/mL   Comment 3           Ct Abdomen Pelvis W Contrast  09/05/2014   CLINICAL DATA:  Abdominal cramping with diarrhea for 6 days, abdominal tenderness. Hypercholesterolemia.  EXAM: CT ABDOMEN AND PELVIS WITH CONTRAST  TECHNIQUE: Multidetector CT imaging of the abdomen and pelvis was performed using the standard protocol following bolus administration of intravenous contrast.  CONTRAST:  14mL OMNIPAQUE IOHEXOL 300 MG/ML  SOLN  COMPARISON:  None.  FINDINGS: LUNG BASES: Included view of the lung bases are clear. Visualized heart appears mildly enlarged, no pericardial fluid collections.  SOLID ORGANS: The spleen, gallbladder, pancreas and adrenal glands are unremarkable. 10 mm hyperenhancing nodular lesion LEFT lobe of the liver, axial of 24/90 most consistent with flash filling hemangioma. Focal  fatty infiltration at the falciform ligament. The liver is diffusely mildly hypodense most consistent with steatosis.  GASTROINTESTINAL TRACT: The stomach, small and large bowel are normal in course and caliber without inflammatory changes. Enteric contrast has not yet reached the distal small bowel. Normal appendix.  KIDNEYS/ URINARY TRACT: Kidneys are orthotopic, demonstrating symmetric enhancement. No nephrolithiasis, hydronephrosis or solid renal masses. Soft upper pole cortical scarring. RIGHT extra renal pelvis. The unopacified ureters are normal in course and caliber. Delayed imaging through the kidneys demonstrates symmetric prompt contrast excretion within the proximal urinary collecting system. Urinary bladder is partially distended and unremarkable.  PERITONEUM/RETROPERITONEUM: Aortoiliac vessels are normal in course and caliber. No lymphadenopathy by CT size criteria. Enlarged lobulated uterus with multiple hypoenhancing uterine masses most consistent with leiomyoma, extending into the endometrial cavity, 6 cm sub serosal  fundal leiomyoma. Mild suspected RIGHT hydrosalpinx. No intraperitoneal free fluid nor free air.  Phleboliths in the pelvis.  SOFT TISSUE/OSSEOUS STRUCTURES: Non-suspicious. Grade 1 L5-S1 anterolisthesis without spondylolysis. Facet arthropathy resulting in severe L5-S1 neural foraminal narrowing.  IMPRESSION: No acute intra-abdominal or pelvic process.  Multiple leiomyomas including submucosal and sub serosal masses as described above. Findings would be better characterized on pelvic ultrasound on a nonemergent basis. In addition, suspected mild RIGHT hydrosalpinx.  10 mm probable flash filling hemangioma in LEFT lobe of the liver.   Electronically Signed   By: Elon Alas   On: 09/05/2014 01:27    1. Abdominal pain, acute   2. Diarrhea   3. Abdominal pain, acute   4. Diarrhea    CT scan results reviewed and discussed with patient. No evidence of diverticulitis. Advised PCP follow-up for following of hemangioma. Advised OB/GYN follow-up for leiomyomas. Pain and symptoms improved on emergency department. Will discharge home with appropriate pain management. Return precautions discussed. Patient is agreeable to plan and ready for discharge. Patient is stable at time of discharge   Harlow Mares, PA-C 09/05/14 [id number]  Julianne Rice, MD 09/05/14 [phone]

## 2014-09-05 NOTE — ED Notes (Signed)
CT notified pt finished contrast  

## 2014-09-05 NOTE — Discharge Instructions (Signed)
Please follow up with your primary care physician in 1-2 days. If you do not have one please call the [facility] number listed above. Please read all discharge instructions and return precautions.    Abdominal Pain, Women Abdominal (stomach, pelvic, or belly) pain can be caused by many things. It is important to tell your doctor:  The location of the pain.  Does it come and go or is it present all the time?  Are there things that start the pain (eating certain foods, exercise)?  Are there other symptoms associated with the pain (fever, nausea, vomiting, diarrhea)? All of this is helpful to know when trying to find the cause of the pain. CAUSES   Stomach: virus or bacteria infection, or ulcer.  Intestine: appendicitis (inflamed appendix), regional ileitis (Crohn's disease), ulcerative colitis (inflamed colon), irritable bowel syndrome, diverticulitis (inflamed diverticulum of the colon), or cancer of the stomach or intestine.  Gallbladder disease or stones in the gallbladder.  Kidney disease, kidney stones, or infection.  Pancreas infection or cancer.  Fibromyalgia (pain disorder).  Diseases of the female organs:  Uterus: fibroid (non-cancerous) tumors or infection.  Fallopian tubes: infection or tubal pregnancy.  Ovary: cysts or tumors.  Pelvic adhesions (scar tissue).  Endometriosis (uterus lining tissue growing in the pelvis and on the pelvic organs).  Pelvic congestion syndrome (female organs filling up with blood just before the menstrual period).  Pain with the menstrual period.  Pain with ovulation (producing an egg).  Pain with an IUD (intrauterine device, birth control) in the uterus.  Cancer of the female organs.  Functional pain (pain not caused by a disease, may improve without treatment).  Psychological pain.  Depression. DIAGNOSIS  Your doctor will decide the seriousness of your pain by doing an examination.  Blood  tests.  X-rays.  Ultrasound.  CT scan (computed tomography, special type of X-ray).  MRI (magnetic resonance imaging).  Cultures, for infection.  Barium enema (dye inserted in the large intestine, to better view it with X-rays).  Colonoscopy (looking in intestine with a lighted tube).  Laparoscopy (minor surgery, looking in abdomen with a lighted tube).  Major abdominal exploratory surgery (looking in abdomen with a large incision). TREATMENT  The treatment will depend on the cause of the pain.   Many cases can be observed and treated at home.  Over-the-counter medicines recommended by your caregiver.  Prescription medicine.  Antibiotics, for infection.  Birth control pills, for painful periods or for ovulation pain.  Hormone treatment, for endometriosis.  Nerve blocking injections.  Physical therapy.  Antidepressants.  Counseling with a psychologist or psychiatrist.  Minor or major surgery. HOME CARE INSTRUCTIONS   Do not take laxatives, unless directed by your caregiver.  Take over-the-counter pain medicine only if ordered by your caregiver. Do not take aspirin because it can cause an upset stomach or bleeding.  Try a clear liquid diet (broth or water) as ordered by your caregiver. Slowly move to a bland diet, as tolerated, if the pain is related to the stomach or intestine.  Have a thermometer and take your temperature several times a day, and record it.  Bed rest and sleep, if it helps the pain.  Avoid sexual intercourse, if it causes pain.  Avoid stressful situations.  Keep your follow-up appointments and tests, as your caregiver orders.  If the pain does not go away with medicine or surgery, you may try:  Acupuncture.  Relaxation exercises (yoga, meditation).  Group therapy.  Counseling. SEEK  MEDICAL CARE IF:   You notice certain foods cause stomach pain.  Your home care treatment is not helping your pain.  You need stronger pain  medicine.  You want your IUD removed.  You feel faint or lightheaded.  You develop nausea and vomiting.  You develop a rash.  You are having side effects or an allergy to your medicine. SEEK IMMEDIATE MEDICAL CARE IF:   Your pain does not go away or gets worse.  You have a fever.  Your pain is felt only in portions of the abdomen. The right side could possibly be appendicitis. The left lower portion of the abdomen could be colitis or diverticulitis.  You are passing blood in your stools (bright red or black tarry stools, with or without vomiting).  You have blood in your urine.  You develop chills, with or without a fever.  You pass out. MAKE SURE YOU:   Understand these instructions.  Will watch your condition.  Will get help right away if you are not doing well or get worse. Document Released: 04/27/2007 Document Revised: 11/14/2013 Document Reviewed: 05/17/2009 [facility] Patient Information 2015 [place], [place]. This information is not intended to replace advice given to you by your health care provider. Make sure you discuss any questions you have with your health care provider.

## 2014-09-05 NOTE — ED Notes (Signed)
Patient transported to CT 

## 2014-10-27 ENCOUNTER — Emergency Department (HOSPITAL_COMMUNITY): Payer: Self-pay

## 2014-10-27 ENCOUNTER — Encounter (HOSPITAL_COMMUNITY): Payer: Self-pay | Admitting: *Deleted

## 2014-10-27 ENCOUNTER — Emergency Department (HOSPITAL_COMMUNITY)
Admission: EM | Admit: 2014-10-27 | Discharge: 2014-10-27 | Disposition: A | Discharge status: Home / Self Care | Payer: Self-pay | Attending: Emergency Medicine | Admitting: Emergency Medicine

## 2014-10-27 DIAGNOSIS — Y9289 Other specified places as the place of occurrence of the external cause: Secondary | ICD-10-CM | POA: Insufficient documentation

## 2014-10-27 DIAGNOSIS — X58XXXA Exposure to other specified factors, initial encounter: Secondary | ICD-10-CM | POA: Insufficient documentation

## 2014-10-27 DIAGNOSIS — S199XXA Unspecified injury of neck, initial encounter: Secondary | ICD-10-CM | POA: Insufficient documentation

## 2014-10-27 DIAGNOSIS — R011 Cardiac murmur, unspecified: Secondary | ICD-10-CM | POA: Insufficient documentation

## 2014-10-27 DIAGNOSIS — Z79899 Other long term (current) drug therapy: Secondary | ICD-10-CM | POA: Insufficient documentation

## 2014-10-27 DIAGNOSIS — Y93F2 Activity, caregiving, lifting: Secondary | ICD-10-CM | POA: Insufficient documentation

## 2014-10-27 DIAGNOSIS — J45909 Unspecified asthma, uncomplicated: Secondary | ICD-10-CM | POA: Insufficient documentation

## 2014-10-27 DIAGNOSIS — Z8639 Personal history of other endocrine, nutritional and metabolic disease: Secondary | ICD-10-CM | POA: Insufficient documentation

## 2014-10-27 DIAGNOSIS — Z8669 Personal history of other diseases of the nervous system and sense organs: Secondary | ICD-10-CM | POA: Insufficient documentation

## 2014-10-27 DIAGNOSIS — S43401A Unspecified sprain of right shoulder joint, initial encounter: Secondary | ICD-10-CM | POA: Insufficient documentation

## 2014-10-27 DIAGNOSIS — I10 Essential (primary) hypertension: Secondary | ICD-10-CM | POA: Insufficient documentation

## 2014-10-27 DIAGNOSIS — Y990 Civilian activity done for income or pay: Secondary | ICD-10-CM | POA: Insufficient documentation

## 2014-10-27 DIAGNOSIS — S299XXA Unspecified injury of thorax, initial encounter: Secondary | ICD-10-CM | POA: Insufficient documentation

## 2014-10-27 MED ORDER — HYDROCODONE-ACETAMINOPHEN 5-325 MG PO TABS: ORAL_TABLET | ORAL | Status: DC

## 2014-10-27 MED ORDER — CYCLOBENZAPRINE HCL 10 MG PO TABS: 10.0000 mg | ORAL_TABLET | Freq: Two times a day (BID) | ORAL | Status: DC | PRN

## 2014-10-27 NOTE — ED Notes (Signed)
Pt reports heavy lifting at work last night, now has right shoulder pain, neck pain and mid back pain. Ambulatory at triage.

## 2014-10-27 NOTE — ED Provider Notes (Signed)
CSN: [id number]     Arrival date & time 10/27/14  1154 History  This chart was scribed for non-physician practitioner, Carlisle Cater, PA-C working with Francine Graven, DO by Tula Nakayama, ED scribe. This patient was seen in room TR07C/TR07C and the patient's care was started at 1:01 PM   Chief Complaint  Patient presents with  . Shoulder Pain  . Back Pain   The history is provided by the patient. No language interpreter was used.   HPI Comments: Brenda Peters is a 59 y.o. female with a history of chronic back pain who presents to the Emergency Department complaining of constant, moderate right shoulder pain that started last night. She states swelling, upper back pain and right-sided neck pain as associated symptoms. She tried hot-and-cold cream with no relief. Pt reports that she was working in [employer] when she lifted a food tray and felt a popping sensation in her right shoulder. She was able to continue working, but notes increased discomfort with movement. Pt denies numbness and tingling as associated symptoms.  Past Medical History  Diagnosis Date  . Heart palpitations   . Heart murmur   . Hypertension   . High cholesterol   . Carpal tunnel syndrome   . Asthma    Past Surgical History  Procedure Laterality Date  . Tubal ligation     Family History  Problem Relation Age of Onset  . Diabetes Mother   . Hypertension Mother    History  Substance Use Topics  . Smoking status: Never Smoker   . Smokeless tobacco: Not on file  . Alcohol Use: No   OB History    No data available     Review of Systems  Constitutional: Negative for activity change.  Musculoskeletal: Positive for back pain, joint swelling, arthralgias and neck pain.  Skin: Negative for wound.  Neurological: Negative for weakness and numbness.    Allergies  Codeine; Lisinopril; and Lovastatin  Home Medications   Prior to Admission medications   Medication Sig Start Date End Date Taking?  Authorizing Provider  cyclobenzaprine (FLEXERIL) 10 MG tablet Take 1 tablet (10 mg total) by mouth 2 (two) times daily as needed for muscle spasms. 03/23/14   Tammy Sours, MD  hydrochlorothiazide (HYDRODIURIL) 25 MG tablet Take 25 mg by mouth daily.    Historical Provider, MD  HYDROcodone-acetaminophen (NORCO/VICODIN) 5-325 MG per tablet Take 1-2 tablets by mouth every 6 (six) hours as needed for moderate pain or severe pain. 05/03/14   Noland Fordyce, PA-C  loperamide (IMODIUM) 2 MG capsule Take 1 capsule (2 mg total) by mouth 4 (four) times daily as needed for diarrhea or loose stools. 09/05/14   Jennifer Piepenbrink, PA-C  meloxicam (MOBIC) 7.5 MG tablet Take 1-2 tabs daily for 5 days, then daily as needed for pain. 05/03/14   Noland Fordyce, PA-C  Multiple Vitamin (MULTIVITAMIN WITH MINERALS) TABS Take 1 tablet by mouth daily.    Historical Provider, MD  neomycin-bacitracin-polymyxin (NEOSPORIN) ointment Apply 1 application topically as needed for wound care.    Historical Provider, MD  Omega-3 Fatty Acids (FISH OIL) 1000 MG CAPS Take 1,000 mg by mouth daily.     Historical Provider, MD  Tetrahydrozoline HCl (EYE DROPS OP) Place 1 drop into both eyes at bedtime. Bausch and Laum Dry Eyes    Historical Provider, MD  vitamin B-12 (CYANOCOBALAMIN) 1000 MCG tablet Take 1,000 mcg by mouth daily.    Historical Provider, MD   BP 146/81 mmHg  Pulse 83  Temp(Src) 98.5 F (36.9 C) (Oral)  Resp 18  Ht 5' 2.5" (1.588 m)  Wt 157 lb (71.215 kg)  BMI 28.24 kg/m2  SpO2 100%  LMP 12/29/2012   Physical Exam  Constitutional: She appears well-developed and well-nourished. No distress.  HENT:  Head: Normocephalic and atraumatic.  Eyes: Conjunctivae and EOM are normal. Pupils are equal, round, and reactive to light.  Neck: Normal range of motion. Neck supple. No tracheal deviation present.  Cardiovascular: Normal rate.  Exam reveals no decreased pulses.   Pulses:      Radial pulses are 2+ on the right side,  and 2+ on the left side.  Pulmonary/Chest: Effort normal. No respiratory distress.  Musculoskeletal: She exhibits tenderness. She exhibits no edema.       Right shoulder: She exhibits tenderness, bony tenderness and spasm. She exhibits normal range of motion, no swelling and no deformity.       Right elbow: Normal.      Right wrist: Normal.       Cervical back: She exhibits tenderness. She exhibits normal range of motion and no bony tenderness.       Thoracic back: Normal.       Lumbar back: Normal.       Back:       Right upper arm: Normal.       Right forearm: Normal.       Right hand: Normal.  Neurological: She is alert. No sensory deficit.  Motor, sensation, and vascular distal to the injury is fully intact.   Skin: Skin is warm and dry.  Psychiatric: She has a normal mood and affect. Her behavior is normal.  Nursing note and vitals reviewed.   ED Course  Procedures   DIAGNOSTIC STUDIES: Oxygen Saturation is 100% on RA, normal by my interpretation.    COORDINATION OF CARE: 1:03 PM Discussed x-ray results and treatment plan with pt which includes muscle relaxant, rest and follow-up with her orthopedic doctor. She agreed to plan.   Labs Review Labs Reviewed - No data to display  Imaging Review Dg Shoulder Right  10/27/2014   CLINICAL DATA:  Acute right shoulder pain after being hit by tray of food at work. Initial encounter.  EXAM: RIGHT SHOULDER - 2+ VIEW  COMPARISON:  None.  FINDINGS: There is no evidence of fracture or dislocation. There is no evidence of arthropathy or other focal bone abnormality. Soft tissues are unremarkable.  IMPRESSION: Normal right shoulder.   Electronically Signed   By: Marijo Conception, M.D.   On: 10/27/2014 12:51     EKG Interpretation None      Vital signs reviewed and are as follows: Filed Vitals:   10/27/14 1317  BP: 139/81  Pulse: 74  Temp: 97.8 F (36.6 C)  Resp: 16   Patient counseled on proper use of muscle relaxant  medication.  They were told not to drink alcohol, drive any vehicle, or do any dangerous activities while taking this medication.  Patient verbalized understanding.  Patient counseled on use of narcotic pain medications. Counseled not to combine these medications with others containing tylenol. Urged not to drink alcohol, drive, or perform any other activities that requires focus while taking these medications. The patient verbalizes understanding and agrees with the plan.   MDM   Final diagnoses:  Shoulder sprain, right, initial encounter   Patient with shoulder pain and likely sprain. Possible subluxation at onset. Right upper extremity is neurovascularly intact. X-rays are negative. Rest, rice protocol, pain  medication, orthopedic follow-up as needed is indicated at this time.  I personally performed the services described in this documentation, which was scribed in my presence. The recorded information has been reviewed and is accurate.     Carlisle Cater, PA-C 10/27/14 [place], DO 10/28/14 [phone]

## 2014-10-27 NOTE — Discharge Instructions (Signed)
Please read and follow all provided instructions.  Your diagnoses today include:  1. Shoulder sprain, right, initial encounter     Tests performed today include:  An x-ray of the affected area - does NOT show any broken bones  Vital signs. See below for your results today.   Medications prescribed:   Vicodin (hydrocodone/acetaminophen) - narcotic pain medication  DO NOT drive or perform any activities that require you to be awake and alert because this medicine can make you drowsy. BE VERY CAREFUL not to take multiple medicines containing Tylenol (also called acetaminophen). Doing so can lead to an overdose which can damage your liver and cause liver failure and possibly death.   Flexeril (cyclobenzaprine) - muscle relaxer medication  DO NOT drive or perform any activities that require you to be awake and alert because this medicine can make you drowsy.   Take any prescribed medications only as directed.  Home care instructions:   Follow any educational materials contained in this packet  Follow R.I.C.E. Protocol:  R - rest your injury   I  - use ice on injury without applying directly to skin  C - compress injury with bandage or splint  E - elevate the injury as much as possible  Follow-up instructions: Please follow-up with your primary care provider or your orthopedic physician (bone specialist) in 1 week.   Return instructions:   Please return if your fingers are numb or tingling, appear gray or blue, or you have severe pain (also elevate the arm and loosen splint or wrap if you were given one)  Please return to the Emergency Department if you experience worsening symptoms.   Please return if you have any other emergent concerns.  Additional Information:  Your vital signs today were: BP 146/81 mmHg   Pulse 83   Temp(Src) 98.5 F (36.9 C) (Oral)   Resp 18   Ht 5' 2.5" (1.588 m)   Wt 157 lb (71.215 kg)   BMI 28.24 kg/m2   SpO2 100%   LMP 12/29/2012 If your  blood pressure (BP) was elevated above 135/85 this visit, please have this repeated by your doctor within one month. --------------

## 2015-07-13 ENCOUNTER — Encounter (HOSPITAL_COMMUNITY): Payer: Self-pay | Admitting: *Deleted

## 2015-07-13 ENCOUNTER — Emergency Department (HOSPITAL_COMMUNITY)
Admission: EM | Admit: 2015-07-13 | Discharge: 2015-07-13 | Disposition: A | Discharge status: Home / Self Care | Payer: Self-pay | Attending: Emergency Medicine | Admitting: Emergency Medicine

## 2015-07-13 ENCOUNTER — Emergency Department (HOSPITAL_COMMUNITY): Payer: Self-pay

## 2015-07-13 DIAGNOSIS — J45901 Unspecified asthma with (acute) exacerbation: Secondary | ICD-10-CM | POA: Insufficient documentation

## 2015-07-13 DIAGNOSIS — J069 Acute upper respiratory infection, unspecified: Secondary | ICD-10-CM | POA: Insufficient documentation

## 2015-07-13 DIAGNOSIS — I10 Essential (primary) hypertension: Secondary | ICD-10-CM | POA: Insufficient documentation

## 2015-07-13 DIAGNOSIS — R05 Cough: Secondary | ICD-10-CM

## 2015-07-13 DIAGNOSIS — Z79899 Other long term (current) drug therapy: Secondary | ICD-10-CM | POA: Insufficient documentation

## 2015-07-13 DIAGNOSIS — R011 Cardiac murmur, unspecified: Secondary | ICD-10-CM | POA: Insufficient documentation

## 2015-07-13 DIAGNOSIS — Z8669 Personal history of other diseases of the nervous system and sense organs: Secondary | ICD-10-CM | POA: Insufficient documentation

## 2015-07-13 DIAGNOSIS — R0789 Other chest pain: Secondary | ICD-10-CM | POA: Insufficient documentation

## 2015-07-13 DIAGNOSIS — R059 Cough, unspecified: Secondary | ICD-10-CM

## 2015-07-13 DIAGNOSIS — Z8639 Personal history of other endocrine, nutritional and metabolic disease: Secondary | ICD-10-CM | POA: Insufficient documentation

## 2015-07-13 LAB — BASIC METABOLIC PANEL
ANION GAP: 9 (ref 5–15)
BUN: 13 mg/dL (ref 6–20)
CALCIUM: 9.8 mg/dL (ref 8.9–10.3)
CO2: 22 mmol/L (ref 22–32)
Chloride: 109 mmol/L (ref 101–111)
Creatinine, Ser: 0.93 mg/dL (ref 0.44–1.00)
Glucose, Bld: 102 mg/dL — ABNORMAL HIGH (ref 65–99)
Potassium: 4.1 mmol/L (ref 3.5–5.1)
Sodium: 140 mmol/L (ref 135–145)

## 2015-07-13 LAB — CBC WITH DIFFERENTIAL/PLATELET
BASOS ABS: 0 10*3/uL (ref 0.0–0.1)
BASOS PCT: 1 %
Eosinophils Absolute: 0.2 10*3/uL (ref 0.0–0.7)
Eosinophils Relative: 3 %
HEMATOCRIT: 42.1 % (ref 36.0–46.0)
HEMOGLOBIN: 14.2 g/dL (ref 12.0–15.0)
Lymphocytes Relative: 24 %
Lymphs Abs: 1.5 10*3/uL (ref 0.7–4.0)
MCH: 30.7 pg (ref 26.0–34.0)
MCHC: 33.7 g/dL (ref 30.0–36.0)
MCV: 90.9 fL (ref 78.0–100.0)
Monocytes Absolute: 0.3 10*3/uL (ref 0.1–1.0)
Monocytes Relative: 5 %
NEUTROS ABS: 4.3 10*3/uL (ref 1.7–7.7)
NEUTROS PCT: 69 %
Platelets: 229 10*3/uL (ref 150–400)
RBC: 4.63 MIL/uL (ref 3.87–5.11)
RDW: 13.3 % (ref 11.5–15.5)
WBC: 6.3 10*3/uL (ref 4.0–10.5)

## 2015-07-13 LAB — I-STAT TROPONIN, ED: TROPONIN I, POC: 0 ng/mL (ref 0.00–0.08)

## 2015-07-13 MED ORDER — HYDROCODONE-HOMATROPINE 5-1.5 MG/5ML PO SYRP: 5.0000 mL | ORAL_SOLUTION | Freq: Four times a day (QID) | ORAL | Status: DC | PRN

## 2015-07-13 NOTE — ED Notes (Signed)
Pt reports recent cough and cold symptoms, having chest pain and sob with exertion. Airway intact.

## 2015-07-13 NOTE — ED Provider Notes (Signed)
CSN: [id number]     Arrival date & time 07/13/15  [id number] History   First MD Initiated Contact with Patient 07/13/15 [phone]     Chief Complaint  Patient presents with  . Chest Pain  . Shortness of Breath      HPI Patient has had recent upper respiratory symptoms including cough and runny nose.  Her cough isn't persistent but not productive of sputum.  She denies fever or chills.  She's had chest pain constant for last 3-4 days.  The pain is worse when she coughs and turns.  Denies diaphoresis.  Past Medical History  Diagnosis Date  . Heart palpitations   . Heart murmur   . Hypertension   . High cholesterol   . Carpal tunnel syndrome   . Asthma    Past Surgical History  Procedure Laterality Date  . Tubal ligation     Family History  Problem Relation Age of Onset  . Diabetes Mother   . Hypertension Mother    Social History  Substance Use Topics  . Smoking status: Never Smoker   . Smokeless tobacco: None  . Alcohol Use: No   OB History    No data available     Review of Systems All other systems reviewed and are negative   Allergies  Codeine; Lisinopril; and Lovastatin  Home Medications   Prior to Admission medications   Medication Sig Start Date End Date Taking? Authorizing Provider  cyclobenzaprine (FLEXERIL) 10 MG tablet Take 1 tablet (10 mg total) by mouth 2 (two) times daily as needed for muscle spasms. 10/27/14   Carlisle Cater, PA-C  hydrochlorothiazide (HYDRODIURIL) 25 MG tablet Take 25 mg by mouth daily.    Historical Provider, MD  HYDROcodone-acetaminophen (NORCO/VICODIN) 5-325 MG per tablet Take 1-2 tablets every 6 hours as needed for severe pain 10/27/14   Carlisle Cater, PA-C  loperamide (IMODIUM) 2 MG capsule Take 1 capsule (2 mg total) by mouth 4 (four) times daily as needed for diarrhea or loose stools. 09/05/14   Jennifer Piepenbrink, PA-C  meloxicam (MOBIC) 7.5 MG tablet Take 1-2 tabs daily for 5 days, then daily as needed for pain. 05/03/14   Noland Fordyce, PA-C  Multiple Vitamin (MULTIVITAMIN WITH MINERALS) TABS Take 1 tablet by mouth daily.    Historical Provider, MD  neomycin-bacitracin-polymyxin (NEOSPORIN) ointment Apply 1 application topically as needed for wound care.    Historical Provider, MD  Omega-3 Fatty Acids (FISH OIL) 1000 MG CAPS Take 1,000 mg by mouth daily.     Historical Provider, MD  Tetrahydrozoline HCl (EYE DROPS OP) Place 1 drop into both eyes at bedtime. Bausch and Laum Dry Eyes    Historical Provider, MD  vitamin B-12 (CYANOCOBALAMIN) 1000 MCG tablet Take 1,000 mcg by mouth daily.    Historical Provider, MD   BP 143/105 mmHg  Pulse 63  Temp(Src) 98.4 F (36.9 C) (Oral)  Resp 20  SpO2 97%  LMP 12/29/2012 Physical Exam  Constitutional: She is oriented to person, place, and time. She appears well-developed and well-nourished. No distress.  HENT:  Head: Normocephalic and atraumatic.  Eyes: Pupils are equal, round, and reactive to light.  Neck: Normal range of motion.  Cardiovascular: Normal rate and intact distal pulses.   Pulmonary/Chest: No respiratory distress.    Areas of reproducible chest wall tenderness to palpation  Abdominal: Normal appearance. She exhibits no distension.  Musculoskeletal: Normal range of motion.  Neurological: She is alert and oriented to person, place, and time. No cranial nerve  deficit.  Skin: Skin is warm and dry. No rash noted.  Psychiatric: She has a normal mood and affect. Her behavior is normal.  Nursing note and vitals reviewed.   ED Course  Procedures (including critical care time) Labs Review Labs Reviewed  BASIC METABOLIC PANEL - Abnormal; Notable for the following:    Glucose, Bld 102 (*)    All other components within normal limits  CBC WITH DIFFERENTIAL/PLATELET  I-STAT TROPOININ, ED    Imaging Review Dg Chest 2 View  07/13/2015  CLINICAL DATA:  Cough, congestion and left-sided chest pain extending down the arm. Personal history of asthma. EXAM: CHEST   2 VIEW COMPARISON:  01/16/2014 FINDINGS: Heart size is normal. Mediastinal shadows are normal. The lungs are clear. No bronchial thickening. No infiltrate, mass, effusion or collapse. Pulmonary vascularity is normal. No bony abnormality. IMPRESSION: Normal chest Electronically Signed   By: Nelson Chimes M.D.   On: 07/13/2015 11:04   I have personally reviewed and evaluated these images and lab results as part of my medical decision-making.   EKG Interpretation   Date/Time:  Friday July 13 2015 10:04:03 EST Ventricular Rate:  74 PR Interval:  151 QRS Duration: 91 QT Interval:  393 QTC Calculation: 436 R Axis:   21 Text Interpretation:  Sinus rhythm Low voltage, precordial leads Baseline  wander in lead(s) I II III aVR aVF V1 V5 Otherwise normal ECG Confirmed by  Randalyn Ahmed  MD, Destani Wamser ([id number]) on 07/13/2015 10:20:29 AM     Patient has reproducible chest wall pain probably secondary to her coughing.  Discussed with her the use of hydrocodone and she says she's taken hydrocodone before with no problems.  Her listed allergy to codeine is most likely not significant.  Will prescribe Hycodan for conservative treatment. MDM   Final diagnoses:  Upper respiratory tract infection  Cough  Chest wall pain        Leonard Schwartz, MD 07/13/15 1148

## 2015-07-13 NOTE — ED Notes (Signed)
Patient brought back to room via wheelchair by Darci Current, RN with myself in tow; patient undressed, in gown, on continuous pulse oximetry and blood pressure cuff

## 2015-07-13 NOTE — Discharge Instructions (Signed)

## 2016-01-14 ENCOUNTER — Emergency Department (HOSPITAL_COMMUNITY): Payer: Self-pay

## 2016-01-14 ENCOUNTER — Encounter (HOSPITAL_COMMUNITY): Payer: Self-pay | Admitting: Emergency Medicine

## 2016-01-14 ENCOUNTER — Emergency Department (HOSPITAL_COMMUNITY)
Admission: EM | Admit: 2016-01-14 | Discharge: 2016-01-14 | Disposition: A | Discharge status: Home / Self Care | Payer: Self-pay | Attending: Emergency Medicine | Admitting: Emergency Medicine

## 2016-01-14 DIAGNOSIS — I10 Essential (primary) hypertension: Secondary | ICD-10-CM | POA: Insufficient documentation

## 2016-01-14 DIAGNOSIS — M109 Gout, unspecified: Secondary | ICD-10-CM | POA: Insufficient documentation

## 2016-01-14 DIAGNOSIS — Z79899 Other long term (current) drug therapy: Secondary | ICD-10-CM | POA: Insufficient documentation

## 2016-01-14 DIAGNOSIS — R51 Headache: Secondary | ICD-10-CM | POA: Insufficient documentation

## 2016-01-14 DIAGNOSIS — J45909 Unspecified asthma, uncomplicated: Secondary | ICD-10-CM | POA: Insufficient documentation

## 2016-01-14 LAB — CBC WITH DIFFERENTIAL/PLATELET
Basophils Absolute: 0 10*3/uL (ref 0.0–0.1)
Basophils Relative: 0 %
Eosinophils Absolute: 0.1 10*3/uL (ref 0.0–0.7)
Eosinophils Relative: 1 %
HEMATOCRIT: 35.5 % — AB (ref 36.0–46.0)
HEMOGLOBIN: 12 g/dL (ref 12.0–15.0)
LYMPHS ABS: 1.9 10*3/uL (ref 0.7–4.0)
Lymphocytes Relative: 17 %
MCH: 30.1 pg (ref 26.0–34.0)
MCHC: 33.8 g/dL (ref 30.0–36.0)
MCV: 89 fL (ref 78.0–100.0)
MONO ABS: 0.7 10*3/uL (ref 0.1–1.0)
MONOS PCT: 6 %
NEUTROS ABS: 8.3 10*3/uL — AB (ref 1.7–7.7)
NEUTROS PCT: 76 %
Platelets: 280 10*3/uL (ref 150–400)
RBC: 3.99 MIL/uL (ref 3.87–5.11)
RDW: 13.2 % (ref 11.5–15.5)
WBC: 10.9 10*3/uL — ABNORMAL HIGH (ref 4.0–10.5)

## 2016-01-14 LAB — URIC ACID: Uric Acid, Serum: 7.7 mg/dL — ABNORMAL HIGH (ref 2.3–6.6)

## 2016-01-14 LAB — BASIC METABOLIC PANEL
Anion gap: 9 (ref 5–15)
BUN: 12 mg/dL (ref 6–20)
CHLORIDE: 106 mmol/L (ref 101–111)
CO2: 24 mmol/L (ref 22–32)
CREATININE: 0.92 mg/dL (ref 0.44–1.00)
Calcium: 9.5 mg/dL (ref 8.9–10.3)
GFR calc Af Amer: >60 mL/min (ref >60)
GFR calc non Af Amer: >60 mL/min (ref >60)
GLUCOSE: 109 mg/dL — AB (ref 65–99)
Potassium: 3.7 mmol/L (ref 3.5–5.1)
Sodium: 139 mmol/L (ref 135–145)

## 2016-01-14 LAB — C-REACTIVE PROTEIN: CRP: 8.8 mg/dL — AB (ref <1.0)

## 2016-01-14 LAB — SEDIMENTATION RATE: Sed Rate: 35 mm/hr — ABNORMAL HIGH (ref 0–22)

## 2016-01-14 MED ORDER — PREDNISONE 20 MG PO TABS
60.0000 mg | ORAL_TABLET | Freq: Once | ORAL | Status: AC
  Administered 2016-01-14: 60 mg via ORAL
  Filled 2016-01-14: qty 3

## 2016-01-14 MED ORDER — SODIUM CHLORIDE 0.9 % IV BOLUS (SEPSIS)
1000.0000 mL | Freq: Once | INTRAVENOUS | Status: AC
  Administered 2016-01-14: 1000 mL via INTRAVENOUS

## 2016-01-14 MED ORDER — IBUPROFEN 600 MG PO TABS: 600.0000 mg | ORAL_TABLET | Freq: Four times a day (QID) | ORAL | Status: DC | PRN

## 2016-01-14 MED ORDER — HYDROMORPHONE HCL 1 MG/ML IJ SOLN: 0.5000 mg | Freq: Once | INTRAMUSCULAR | Status: DC

## 2016-01-14 MED ORDER — PREDNISONE 10 MG PO TABS: ORAL_TABLET | ORAL | Status: DC

## 2016-01-14 MED ORDER — DIPHENHYDRAMINE HCL 50 MG/ML IJ SOLN
25.0000 mg | Freq: Once | INTRAMUSCULAR | Status: AC
  Administered 2016-01-14: 25 mg via INTRAVENOUS
  Filled 2016-01-14: qty 1

## 2016-01-14 MED ORDER — METOCLOPRAMIDE HCL 5 MG/ML IJ SOLN
10.0000 mg | Freq: Once | INTRAMUSCULAR | Status: AC
  Administered 2016-01-14: 10 mg via INTRAVENOUS
  Filled 2016-01-14: qty 2

## 2016-01-14 MED ORDER — KETOROLAC TROMETHAMINE 30 MG/ML IJ SOLN
30.0000 mg | Freq: Once | INTRAMUSCULAR | Status: AC
  Administered 2016-01-14: 30 mg via INTRAVENOUS
  Filled 2016-01-14: qty 1

## 2016-01-14 NOTE — Discharge Instructions (Signed)

## 2016-01-14 NOTE — ED Notes (Signed)
RN starting IV and drawing labs 

## 2016-01-14 NOTE — ED Notes (Signed)
Patient transported to X-ray 

## 2016-01-14 NOTE — ED Notes (Signed)
Pt noticed L ankle swelling Saturday am upon awakening. Pain and swelling have gradually moved up leg. No known injury or period of immobility. Pain worse when bearing weight. Pt also having a HA. Denies CP/SOB.

## 2016-01-16 NOTE — ED Provider Notes (Signed)
CSN: [id number]     Arrival date & time 01/14/16  1619 History   First MD Initiated Contact with Patient 01/14/16 1811     Chief Complaint  Patient presents with  . Leg Swelling  . Headache     (Consider location/radiation/quality/duration/timing/severity/associated sxs/prior Treatment) HPI Comments: 60 year old female with history of hypertension, carpal tunnel syndrome presents for left ankle swelling. The patient reports the swelling came out of the blue. No trauma. She reports that the skin is even tender to the faintest touch. She says that the ankle has been red in the area. She denies ever having pain or swelling like this before. Denies any fevers or chills. Otherwise she feels well. She says it is difficult to walk secondary to the pain.  Patient is a 60 y.o. female presenting with headaches.  Headache Associated symptoms: no abdominal pain, no back pain, no congestion, no cough, no diarrhea, no drainage, no fever, no myalgias, no nausea, no neck pain, no numbness, no vomiting and no weakness     Past Medical History  Diagnosis Date  . Heart palpitations   . Heart murmur   . Hypertension   . High cholesterol   . Carpal tunnel syndrome   . Asthma    Past Surgical History  Procedure Laterality Date  . Tubal ligation     Family History  Problem Relation Age of Onset  . Diabetes Mother   . Hypertension Mother    Social History  Substance Use Topics  . Smoking status: Never Smoker   . Smokeless tobacco: None  . Alcohol Use: No   OB History    No data available     Review of Systems  Constitutional: Negative for fever, chills and appetite change.  HENT: Negative for congestion, postnasal drip and rhinorrhea.   Eyes: Negative for visual disturbance.  Respiratory: Negative for cough, chest tightness and shortness of breath.   Cardiovascular: Negative for chest pain and palpitations.  Gastrointestinal: Negative for nausea, vomiting, abdominal pain and diarrhea.    Genitourinary: Negative for dysuria, urgency and hematuria.  Musculoskeletal: Positive for arthralgias (left ankle). Negative for myalgias, back pain and neck pain.  Skin: Positive for color change (redness of the lateral left ankle).  Neurological: Positive for headaches. Negative for syncope, weakness and numbness.  Hematological: Does not bruise/bleed easily.      Allergies  Codeine; Lisinopril; and Lovastatin  Home Medications   Prior to Admission medications   Medication Sig Start Date End Date Taking? Authorizing Provider  Multiple Vitamin (MULTIVITAMIN WITH MINERALS) TABS Take 1 tablet by mouth daily.   Yes Historical Provider, MD  Omega-3 Fatty Acids (FISH OIL) 1000 MG CAPS Take 1,000 mg by mouth daily.    Yes Historical Provider, MD  Tetrahydrozoline HCl (EYE DROPS OP) Place 1 drop into both eyes at bedtime. Bausch and Laum Dry Eyes   Yes Historical Provider, MD  ibuprofen (ADVIL,MOTRIN) 600 MG tablet Take 1 tablet (600 mg total) by mouth every 6 (six) hours as needed. 01/14/16   Harvel Quale, MD  predniSONE (DELTASONE) 10 MG tablet Day 1:take 4 tabls Day 2:take 4 tabs Day 3:take 3 tabs Day 4:take 3 tabs Day 5:take 2 tabs Day 6:take 2 tabs Day 7:take 1 tabs Day 8: take 1 tabs 01/14/16   Harvel Quale, MD   BP 136/84 mmHg  Pulse 89  Temp(Src) 97.7 F (36.5 C) (Oral)  Resp 16  SpO2 97%  LMP 12/29/2012 Physical Exam  Constitutional: She is oriented  to person, place, and time. She appears well-developed and well-nourished. No distress.  HENT:  Head: Normocephalic and atraumatic.  Right Ear: External ear normal.  Left Ear: External ear normal.  Nose: Nose normal.  Mouth/Throat: Oropharynx is clear and moist. No oropharyngeal exudate.  Eyes: EOM are normal. Pupils are equal, round, and reactive to light.  Neck: Normal range of motion. Neck supple.  Cardiovascular: Normal rate, regular rhythm, normal heart sounds and intact distal pulses.   No murmur  heard. Pulmonary/Chest: Effort normal. No respiratory distress. She has no wheezes. She has no rales.  Abdominal: Soft. She exhibits no distension. There is no tenderness.  Musculoskeletal: She exhibits no edema.       Left knee: Normal.       Left ankle: She exhibits decreased range of motion and swelling. She exhibits no laceration and normal pulse. Tenderness.       Feet:  Neurological: She is alert and oriented to person, place, and time.  Skin: Skin is warm and dry. No rash noted. She is not diaphoretic.  Vitals reviewed.   ED Course  Procedures (including critical care time) Labs Review Labs Reviewed  CBC WITH DIFFERENTIAL/PLATELET - Abnormal; Notable for the following:    WBC 10.9 (*)    HCT 35.5 (*)    Neutro Abs 8.3 (*)    All other components within normal limits  BASIC METABOLIC PANEL - Abnormal; Notable for the following:    Glucose, Bld 109 (*)    All other components within normal limits  SEDIMENTATION RATE - Abnormal; Notable for the following:    Sed Rate 35 (*)    All other components within normal limits  C-REACTIVE PROTEIN - Abnormal; Notable for the following:    CRP 8.8 (*)    All other components within normal limits  URIC ACID - Abnormal; Notable for the following:    Uric Acid, Serum 7.7 (*)    All other components within normal limits    Imaging Review Dg Lumbar Spine Complete  01/14/2016  CLINICAL DATA:  Lumbago for 3 days EXAM: LUMBAR SPINE - COMPLETE 4+ VIEW COMPARISON:  None. FINDINGS: Frontal, lateral, spot lumbosacral lateral, and bilateral oblique views were obtained. There are 5 non-rib-bearing lumbar type vertebral bodies. There is no fracture or spondylolisthesis. The disc spaces appear normal. There is extensive facet osteoarthritic change at L5-S1 bilaterally. There are phleboliths in the pelvis. A calcification in the right pelvis is likely due to calcified uterine leiomyoma. 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 No fracture or spondylolisthesis. Evidence of calcified uterine leiomyoma right pelvis. Electronically Signed   By: Lowella Grip III M.D.   On: 01/14/2016 18:56   Dg Ankle Complete Left  01/14/2016  CLINICAL DATA:  Left ankle pain 3 days worse laterally.  No injury. EXAM: LEFT ANKLE COMPLETE - 3+ VIEW COMPARISON:  11/10/2013 FINDINGS: Soft tissue swelling over the ankle unchanged. The ankle mortise is within normal. No acute fracture or dislocation. IMPRESSION: No acute fracture. Electronically Signed   By: Marin Olp M.D.   On: 01/14/2016 17:14   I have personally reviewed and evaluated these images and lab results as part of my medical decision-making.   EKG Interpretation None      MDM  Patient was seen and evaluated in stable condition. Physical examination most consistent with gout. Patient also seen by dr. Audie Pinto who agreed that this appeared to be gout.  Uric acid elevated. X-rays negative for acute process other  than soft tissue swelling. Patient was given pain medication. She was also given a dose of prednisone. She was discharged home on a prednisone taper as well as pain medication. She was instructed to follow-up with her primary care physician. Final diagnoses:  Acute gout, unspecified cause, unspecified site    1. Acute gout    Harvel Quale, MD 01/16/16 [phone]

## 2016-04-04 ENCOUNTER — Emergency Department (HOSPITAL_COMMUNITY): Payer: Self-pay

## 2016-04-04 ENCOUNTER — Encounter (HOSPITAL_COMMUNITY): Payer: Self-pay | Admitting: Emergency Medicine

## 2016-04-04 ENCOUNTER — Emergency Department (HOSPITAL_COMMUNITY)
Admission: EM | Admit: 2016-04-04 | Discharge: 2016-04-04 | Disposition: A | Discharge status: Home / Self Care | Payer: Self-pay | Attending: Emergency Medicine | Admitting: Emergency Medicine

## 2016-04-04 DIAGNOSIS — I10 Essential (primary) hypertension: Secondary | ICD-10-CM | POA: Insufficient documentation

## 2016-04-04 DIAGNOSIS — Z91040 Latex allergy status: Secondary | ICD-10-CM | POA: Insufficient documentation

## 2016-04-04 DIAGNOSIS — B349 Viral infection, unspecified: Secondary | ICD-10-CM | POA: Insufficient documentation

## 2016-04-04 DIAGNOSIS — J45909 Unspecified asthma, uncomplicated: Secondary | ICD-10-CM | POA: Insufficient documentation

## 2016-04-04 LAB — CBC WITH DIFFERENTIAL/PLATELET
BASOS ABS: 0 10*3/uL (ref 0.0–0.1)
Basophils Relative: 1 %
EOS ABS: 0.2 10*3/uL (ref 0.0–0.7)
EOS PCT: 3 %
HCT: 43.4 % (ref 36.0–46.0)
Hemoglobin: 14.1 g/dL (ref 12.0–15.0)
LYMPHS PCT: 27 %
Lymphs Abs: 1.3 10*3/uL (ref 0.7–4.0)
MCH: 30.1 pg (ref 26.0–34.0)
MCHC: 32.5 g/dL (ref 30.0–36.0)
MCV: 92.5 fL (ref 78.0–100.0)
MONO ABS: 0.3 10*3/uL (ref 0.1–1.0)
Monocytes Relative: 6 %
Neutro Abs: 3.1 10*3/uL (ref 1.7–7.7)
Neutrophils Relative %: 63 %
PLATELETS: 233 10*3/uL (ref 150–400)
RBC: 4.69 MIL/uL (ref 3.87–5.11)
RDW: 13.2 % (ref 11.5–15.5)
WBC: 4.9 10*3/uL (ref 4.0–10.5)

## 2016-04-04 LAB — BASIC METABOLIC PANEL
ANION GAP: 8 (ref 5–15)
BUN: 10 mg/dL (ref 6–20)
CALCIUM: 9.5 mg/dL (ref 8.9–10.3)
CO2: 22 mmol/L (ref 22–32)
Chloride: 109 mmol/L (ref 101–111)
Creatinine, Ser: 0.89 mg/dL (ref 0.44–1.00)
GFR calc Af Amer: >60 mL/min (ref >60)
GLUCOSE: 97 mg/dL (ref 65–99)
Potassium: 4.3 mmol/L (ref 3.5–5.1)
Sodium: 139 mmol/L (ref 135–145)

## 2016-04-04 MED ORDER — SODIUM CHLORIDE 0.9 % IV BOLUS (SEPSIS)
500.0000 mL | Freq: Once | INTRAVENOUS | Status: AC
  Administered 2016-04-04: 500 mL via INTRAVENOUS

## 2016-04-04 MED ORDER — LOPERAMIDE HCL 2 MG PO CAPS: 2.0000 mg | ORAL_CAPSULE | Freq: Four times a day (QID) | ORAL | 0 refills | Status: DC | PRN

## 2016-04-04 NOTE — ED Triage Notes (Signed)
Pt here for flu like sx, fever, congestion, body aches and diarrhea

## 2016-04-04 NOTE — ED Notes (Signed)
Patient transported to X-ray 

## 2016-04-04 NOTE — Discharge Instructions (Signed)
Your CXR and blood work was normal today. Drink plenty of fluids and get plenty of rest. Return for worsening symptoms, including intractable vomiting, confusion, difficulty breathing, or any other symptoms concerning to you.

## 2016-04-04 NOTE — ED Notes (Signed)
Noticed pt HR in the low 50s, placed pt on cardiac monitor. HR is still in the low 50s. RN aware.

## 2016-04-04 NOTE — ED Provider Notes (Signed)
[place] DEPT Provider Note   CSN: [id number] Arrival date & time: 04/04/16  0818     History   Chief Complaint Chief Complaint  Patient presents with  . Influenza    HPI Brenda Peters is a 60 y.o. female.  The history is provided by the patient.  Influenza  Presenting symptoms: cough, diarrhea, fatigue, headache, nausea, rhinorrhea and sore throat   Presenting symptoms: no shortness of breath and no vomiting   Severity:  Moderate Onset quality:  Gradual Duration:  1 week Progression:  Improving Chronicity:  New Relieved by:  Nothing Worsened by:  Nothing Ineffective treatments:  Rest and drinking Associated symptoms: decreased appetite, decreased physical activity and nasal congestion   Associated symptoms: no mental status change, no neck stiffness and no syncope   Risk factors: sick contacts   Risk factors: no diabetes problem, no immunocompromised state and no kidney disease     Past Medical History:  Diagnosis Date  . Asthma   . Carpal tunnel syndrome   . Heart murmur   . Heart palpitations   . High cholesterol   . Hypertension     There are no active problems to display for this patient.   Past Surgical History:  Procedure Laterality Date  . TUBAL LIGATION      OB History    No data available       Home Medications    Prior to Admission medications   Medication Sig Start Date End Date Taking? Authorizing Provider  GuaiFENesin (MUCINEX MAXIMUM STRENGTH PO) Take 1 tablet by mouth daily as needed.   Yes Historical Provider, MD  ibuprofen (ADVIL,MOTRIN) 200 MG tablet Take 400 mg by mouth 2 (two) times daily as needed (Pain).   Yes Historical Provider, MD  Phenyleph-Diphenhyd-DM-APAP [facility] SEVERE COLD & COUGH PO) Take 1 packet by mouth daily as needed.   Yes Historical Provider, MD  ibuprofen (ADVIL,MOTRIN) 600 MG tablet Take 1 tablet (600 mg total) by mouth every 6 (six) hours as needed. Patient not taking: Reported on 04/04/2016  01/14/16   Harvel Quale, MD  loperamide (IMODIUM) 2 MG capsule Take 1 capsule (2 mg total) by mouth 4 (four) times daily as needed for diarrhea or loose stools. 04/04/16   Forde Dandy, MD    Family History Family History  Problem Relation Age of Onset  . Diabetes Mother   . Hypertension Mother     Social History Social History  Substance Use Topics  . Smoking status: Never Smoker  . Smokeless tobacco: Not on file  . Alcohol use No     Allergies   Codeine; Latex; Lisinopril; and Lovastatin   Review of Systems Review of Systems  Constitutional: Positive for decreased appetite and fatigue.  HENT: Positive for congestion, rhinorrhea and sore throat.   Respiratory: Positive for cough. Negative for shortness of breath.   Gastrointestinal: Positive for diarrhea and nausea. Negative for vomiting.  Musculoskeletal: Negative for neck stiffness.  Neurological: Positive for headaches.  All other systems reviewed and are negative.    Physical Exam Updated Vital Signs BP (!) 141/108 (BP Location: Right Arm)   Pulse 60   Temp 98.7 F (37.1 C) (Oral)   Resp 16   LMP 12/29/2012   SpO2 99%   Physical Exam Physical Exam  Nursing note and vitals reviewed. Constitutional: Well developed, well nourished, non-toxic, and in no acute distress Head: Normocephalic and atraumatic.  Mouth/Throat: Oropharynx is clear and moist.  Neck: Normal range of motion.  Neck supple.  Cardiovascular: Normal rate and regular rhythm.   Pulmonary/Chest: Effort normal and breath sounds normal.  Abdominal: Soft. There is no tenderness. There is no rebound and no guarding.  Musculoskeletal: Normal range of motion.  Neurological: Alert, no facial droop, fluent speech, moves all extremities symmetrically Skin: Skin is warm and dry.  Psychiatric: Cooperative    ED Treatments / Results  Labs (all labs ordered are listed, but only abnormal results are displayed) Labs Reviewed  CBC WITH  DIFFERENTIAL/PLATELET  BASIC METABOLIC PANEL    EKG  EKG Interpretation None       Radiology Dg Chest 2 View  Result Date: 04/04/2016 CLINICAL DATA:  History of diarrhea for 1 week with fever and nausea EXAM: CHEST  2 VIEW COMPARISON:  07/13/2015 FINDINGS: Cardiac shadow is at the upper limits of normal in size. The lungs are clear bilaterally. No acute bony abnormality is seen. IMPRESSION: No active cardiopulmonary disease. Electronically Signed   By: Inez Catalina M.D.   On: 04/04/2016 09:47    Procedures Procedures (including critical care time)  Medications Ordered in ED Medications  sodium chloride 0.9 % bolus 500 mL (500 mLs Intravenous New Bag/Given 04/04/16 1004)     Initial Impression / Assessment and Plan / ED Course  I have reviewed the triage vital signs and the nursing notes.  Pertinent labs & imaging results that were available during my care of the patient were reviewed by me and considered in my medical decision making (see chart for details).  Clinical Course    60 year old female who presents with one week of what sounds like a flulike/viral illness.  She is well-appearing, afebrile and hemodynamically stable. Appears well-hydrated. Unremarkable cardiopulmonary and abdominal exam. Overall symptoms seems to be slowly improving. Low suspicion for serious bacterial infection at this time, like bacterial pneumonia. Chest x-ray shows no acute cardiopulmonary processes. Basic blood work unremarkable. Discussed supportive care management for home.   The patient appears reasonably screened and/or stabilized for discharge and I doubt any other medical condition or other [facility] requiring further screening, evaluation, or treatment in the ED at this time prior to discharge.  Strict return and follow-up instructions reviewed. She expressed understanding of all discharge instructions and felt comfortable with the plan of care.   Final Clinical Impressions(s) / ED Diagnoses    Final diagnoses:  Viral illness    New Prescriptions New Prescriptions   LOPERAMIDE (IMODIUM) 2 MG CAPSULE    Take 1 capsule (2 mg total) by mouth 4 (four) times daily as needed for diarrhea or loose stools.     Forde Dandy, MD 04/04/16 1048

## 2017-12-11 ENCOUNTER — Encounter (HOSPITAL_COMMUNITY): Payer: Self-pay | Admitting: Family Medicine

## 2017-12-11 ENCOUNTER — Ambulatory Visit (HOSPITAL_COMMUNITY)
Admission: EM | Admit: 2017-12-11 | Discharge: 2017-12-11 | Disposition: A | Discharge status: Home / Self Care | Payer: BC Managed Care – PPO | Attending: Family Medicine | Admitting: Family Medicine

## 2017-12-11 DIAGNOSIS — K0889 Other specified disorders of teeth and supporting structures: Secondary | ICD-10-CM

## 2017-12-11 MED ORDER — AMOXICILLIN-POT CLAVULANATE 875-125 MG PO TABS: 1.0000 | ORAL_TABLET | Freq: Two times a day (BID) | ORAL | 0 refills | Status: DC

## 2017-12-11 MED ORDER — IBUPROFEN 800 MG PO TABS: 800.0000 mg | ORAL_TABLET | Freq: Three times a day (TID) | ORAL | 0 refills | Status: DC

## 2017-12-11 NOTE — ED Triage Notes (Signed)
Pt here for dental pain and nasal congestion since last night. She has talked to her dentist

## 2017-12-11 NOTE — Discharge Instructions (Addendum)
Please use dental resource to contact offices to seek permenant treatment/relief.  ° °Today we have given you an antibiotic. This should help with pain as any infection is cleared.  ° °For pain please take 600mg-800mg of Ibuprofen every 8 hours, take with 1000 mg of Tylenol Extra strength every 8 hours. These are safe to take together. Please take with food.  ° °Please return if you start to experience significant swelling of your face, experiencing fever. °

## 2017-12-11 NOTE — ED Provider Notes (Signed)
Brenda Peters    CSN: [id number] Arrival date & time: 12/11/17  1347     History   Chief Complaint Chief Complaint  Patient presents with  . Dental Pain    HPI Brenda Peters is a 62 y.o. female history of hypertension, hyperlipidemia, asthma presenting today for evaluation of dental pain.  Patient states that she has felt swelling along the right side of her face that is extending up to her right nares.  Symptoms have been going on for the past 2 days, began yesterday.  Has been taking ibuprofen, tried peroxide, vanilla clove oil as well as Orajel without relief.  Patient does have dental insurance and is working on getting set up with a Pharmacist, community.  HPI  Past Medical History:  Diagnosis Date  . Asthma   . Carpal tunnel syndrome   . Heart murmur   . Heart palpitations   . High cholesterol   . Hypertension     There are no active problems to display for this patient.   Past Surgical History:  Procedure Laterality Date  . TUBAL LIGATION      OB History   None      Home Medications    Prior to Admission medications   Medication Sig Start Date End Date Taking? Authorizing Provider  amoxicillin-clavulanate (AUGMENTIN) 875-125 MG tablet Take 1 tablet by mouth every 12 (twelve) hours. 12/11/17   Wieters, Hallie C, PA-C  GuaiFENesin (MUCINEX MAXIMUM STRENGTH PO) Take 1 tablet by mouth daily as needed.    [provider]  ibuprofen (ADVIL,MOTRIN) 800 MG tablet Take 1 tablet (800 mg total) by mouth 3 (three) times daily. 12/11/17   Wieters, Hallie C, PA-C  loperamide (IMODIUM) 2 MG capsule Take 1 capsule (2 mg total) by mouth 4 (four) times daily as needed for diarrhea or loose stools. 04/04/16   Forde Dandy, MD  Phenyleph-Diphenhyd-DM-APAP [facility] SEVERE COLD & COUGH PO) Take 1 packet by mouth daily as needed.    [provider]    Family History Family History  Problem Relation Age of Onset  . Diabetes Mother   . Hypertension Mother      Social History Social History   Tobacco Use  . Smoking status: Never Smoker  Substance Use Topics  . Alcohol use: No  . Drug use: No     Allergies   Codeine; Latex; Lisinopril; and Lovastatin   Review of Systems Review of Systems  Constitutional: Negative for chills, fatigue and fever.  HENT: Positive for congestion, dental problem and sinus pressure. Negative for ear pain, rhinorrhea, sore throat and trouble swallowing.   Respiratory: Negative for cough, chest tightness and shortness of breath.   Cardiovascular: Negative for chest pain.  Gastrointestinal: Negative for abdominal pain, nausea and vomiting.  Musculoskeletal: Negative for myalgias.  Skin: Negative for rash.  Neurological: Negative for dizziness, light-headedness and headaches.     Physical Exam Triage Vital Signs ED Triage Vitals  Enc Vitals Group     BP 12/11/17 1451 (!) 145/86     Pulse Rate 12/11/17 1451 87     Resp 12/11/17 1451 18     Temp 12/11/17 1451 98.5 F (36.9 C)     Temp src --      SpO2 12/11/17 1451 99 %     Weight --      Height --      Head Circumference --      Peak Flow --      Pain  Score 12/11/17 1450 8     Pain Loc --      Pain Edu? --      Excl. in [place]? --    No data found.  Updated Vital Signs BP (!) 145/86   Pulse 87   Temp 98.5 F (36.9 C)   Resp 18   LMP 12/29/2012   SpO2 99%   Visual Acuity Right Eye Distance:   Left Eye Distance:   Bilateral Distance:    Right Eye Near:   Left Eye Near:    Bilateral Near:     Physical Exam  Constitutional: She appears well-developed and well-nourished. No distress.  HENT:  Head: Normocephalic and atraumatic.  Mouth/Throat: Oropharynx is clear and moist.  Overall poor dentition, tenderness to palpation of gingiva around right canine, mild swelling over right maxillary area  Eyes: Conjunctivae are normal.  Neck: Neck supple.  Cardiovascular: Normal rate and regular rhythm.  No murmur heard. Pulmonary/Chest:  Effort normal and breath sounds normal. No respiratory distress.  Abdominal: Soft. There is no tenderness.  Musculoskeletal: She exhibits no edema.  Neurological: She is alert.  Skin: Skin is warm and dry.  Psychiatric: She has a normal mood and affect.  Nursing note and vitals reviewed.    UC Treatments / Results  Labs (all labs ordered are listed, but only abnormal results are displayed) Labs Reviewed - No data to display  EKG None  Radiology No results found.  Procedures Procedures (including critical care time)  Medications Ordered in UC Medications - No data to display  Initial Impression / Assessment and Plan / UC Course  I have reviewed the triage vital signs and the nursing notes.  Pertinent labs & imaging results that were available during my care of the patient were reviewed by me and considered in my medical decision making (see chart for details).     Patient with dental pain, will provide Augmentin to take twice daily for the next week to treat possible abscess/infection.  Follow-up with dentistry.  Her pain will recommend anti-inflammatories.  Patient has allergy to codeine that results in palpitations or cardiac arrest, also notes palpitations with tramadol.  Will avoid stronger pain medicine given unknown reaction.Discussed strict return precautions. Patient verbalized understanding and is agreeable with plan.  Final Clinical Impressions(s) / UC Diagnoses   Final diagnoses:  Pain, dental     Discharge Instructions     Please use dental resource to contact offices to seek permenant treatment/relief.   Today we have given you an antibiotic. This should help with pain as any infection is cleared.   For pain please take 600mg -800mg  of Ibuprofen every 8 hours, take with 1000 mg of Tylenol Extra strength every 8 hours. These are safe to take together. Please take with food.   Please return if you start to experience significant swelling of your face,  experiencing fever.   ED Prescriptions    Medication Sig Dispense Auth. Provider   amoxicillin-clavulanate (AUGMENTIN) 875-125 MG tablet Take 1 tablet by mouth every 12 (twelve) hours. 14 tablet Wieters, Hallie C, PA-C   ibuprofen (ADVIL,MOTRIN) 800 MG tablet Take 1 tablet (800 mg total) by mouth 3 (three) times daily. 21 tablet Wieters, [place] C, PA-C     Controlled Substance Prescriptions [place] Controlled Substance Registry consulted? Not Applicable   Janith Lima, [place] 12/11/17 2117

## 2017-12-30 ENCOUNTER — Ambulatory Visit (INDEPENDENT_AMBULATORY_CARE_PROVIDER_SITE_OTHER): Payer: BC Managed Care – PPO

## 2017-12-30 ENCOUNTER — Ambulatory Visit: Payer: BC Managed Care – PPO | Admitting: Podiatry

## 2017-12-30 ENCOUNTER — Encounter: Payer: Self-pay | Admitting: Podiatry

## 2017-12-30 DIAGNOSIS — M79672 Pain in left foot: Secondary | ICD-10-CM

## 2017-12-30 DIAGNOSIS — M79671 Pain in right foot: Secondary | ICD-10-CM

## 2017-12-30 DIAGNOSIS — M779 Enthesopathy, unspecified: Secondary | ICD-10-CM | POA: Diagnosis not present

## 2017-12-30 DIAGNOSIS — M21619 Bunion of unspecified foot: Secondary | ICD-10-CM

## 2017-12-30 DIAGNOSIS — L600 Ingrowing nail: Secondary | ICD-10-CM

## 2017-12-30 MED ORDER — TRIAMCINOLONE ACETONIDE 10 MG/ML IJ SUSP
10.0000 mg | Freq: Once | INTRAMUSCULAR | Status: AC
  Administered 2017-12-30: 10 mg

## 2017-12-30 NOTE — Progress Notes (Signed)
Subjective:   Patient ID: Brenda Peters, female   DOB: 62 y.o.   MRN: [id number]   HPI Patient presents stating she is having a lot of pain in the outside of her right foot is been hurting for a few days and she also has bunion deformity left over right which is significant and she knows she needs to get corrected.  Patient does not smoke and likes to be active   Review of Systems  All other systems reviewed and are negative.       Objective:  Physical Exam  Constitutional: She appears well-developed and well-nourished.  Cardiovascular: Intact distal pulses.  Pulmonary/Chest: Effort normal.  Musculoskeletal: Normal range of motion.  Neurological: She is alert.  Skin: Skin is warm.  Nursing note and vitals reviewed.   Neurovascular status intact muscle strength is adequate range of motion within normal limits with exquisite discomfort outside the right foot with inflammation fluid at the peroneal insertion base of fifth metatarsal with no muscle strength loss and is noted to have large structural bunion deformity that are red and irritated left over right.  Good digital perfusion well oriented x3     Assessment:  Peroneal tendinitis right over left with inflammation and also structural bunion deformity left over right     Plan:  H&P conditions reviewed today careful sheath injection administered right 3 mg Kenalog 5 mg Xylocaine and fascial brace to lift up the lateral side of the foot.  Discussed orthotics due to her overall foot structure and bunion correction which she like to get down but she like to wait till after summer.  Reappoint to review again in the next several weeks  X-ray indicates significant structural bunion deformity bilateral with no indications of arthritis or fracture around the base of fifth metatarsal right

## 2017-12-30 NOTE — Patient Instructions (Signed)

## 2018-01-04 ENCOUNTER — Emergency Department (HOSPITAL_COMMUNITY)
Admission: EM | Admit: 2018-01-04 | Discharge: 2018-01-04 | Disposition: A | Discharge status: Home / Self Care | Payer: BC Managed Care – PPO | Attending: Emergency Medicine | Admitting: Emergency Medicine

## 2018-01-04 ENCOUNTER — Encounter (HOSPITAL_COMMUNITY): Payer: Self-pay | Admitting: Emergency Medicine

## 2018-01-04 ENCOUNTER — Emergency Department (HOSPITAL_COMMUNITY): Payer: BC Managed Care – PPO

## 2018-01-04 DIAGNOSIS — Z79899 Other long term (current) drug therapy: Secondary | ICD-10-CM | POA: Insufficient documentation

## 2018-01-04 DIAGNOSIS — M5441 Lumbago with sciatica, right side: Secondary | ICD-10-CM | POA: Diagnosis present

## 2018-01-04 DIAGNOSIS — I10 Essential (primary) hypertension: Secondary | ICD-10-CM | POA: Insufficient documentation

## 2018-01-04 DIAGNOSIS — J45909 Unspecified asthma, uncomplicated: Secondary | ICD-10-CM | POA: Diagnosis not present

## 2018-01-04 DIAGNOSIS — Z91040 Latex allergy status: Secondary | ICD-10-CM | POA: Diagnosis not present

## 2018-01-04 MED ORDER — HYDROMORPHONE HCL 1 MG/ML IJ SOLN
1.0000 mg | Freq: Once | INTRAMUSCULAR | Status: AC
  Administered 2018-01-04: 1 mg via INTRAMUSCULAR
  Filled 2018-01-04: qty 1

## 2018-01-04 MED ORDER — TRAMADOL HCL 50 MG PO TABS: 50.0000 mg | ORAL_TABLET | Freq: Four times a day (QID) | ORAL | 0 refills | Status: DC | PRN

## 2018-01-04 MED ORDER — PREDNISONE 10 MG PO TABS: 20.0000 mg | ORAL_TABLET | Freq: Every day | ORAL | 0 refills | Status: DC

## 2018-01-04 NOTE — ED Provider Notes (Signed)
[facility] DEPT Provider Note   CSN: [id number] Arrival date & time: 01/04/18  0758     History   Chief Complaint Chief Complaint  Patient presents with  . Leg Pain    HPI Brenda Peters is a 62 y.o. female.  Patient complains of lower back pain radiating down her right leg.  This been going on for couple days.  The history is provided by the patient. No language interpreter was used.  Leg Pain   This is a new problem. The current episode started more than 2 days ago. The problem occurs constantly. The problem has not changed since onset.Pain location: Lumbar spine and right leg. The quality of the pain is described as aching. The pain is at a severity of 6/10. The pain is moderate. Pertinent negatives include no numbness. The symptoms are aggravated by activity. She has tried nothing for the symptoms.    Past Medical History:  Diagnosis Date  . Asthma   . Carpal tunnel syndrome   . Heart murmur   . Heart palpitations   . High cholesterol   . Hypertension     There are no active problems to display for this patient.   Past Surgical History:  Procedure Laterality Date  . TUBAL LIGATION       OB History   None      Home Medications    Prior to Admission medications   Medication Sig Start Date End Date Taking? Authorizing Provider  amoxicillin-clavulanate (AUGMENTIN) 875-125 MG tablet Take 1 tablet by mouth every 12 (twelve) hours. 12/11/17   Wieters, Hallie C, PA-C  GuaiFENesin (MUCINEX MAXIMUM STRENGTH PO) Take 1 tablet by mouth daily as needed.    [provider]  ibuprofen (ADVIL,MOTRIN) 800 MG tablet Take 1 tablet (800 mg total) by mouth 3 (three) times daily. 12/11/17   Wieters, Hallie C, PA-C  loperamide (IMODIUM) 2 MG capsule Take 1 capsule (2 mg total) by mouth 4 (four) times daily as needed for diarrhea or loose stools. 04/04/16   Forde Dandy, MD  Phenyleph-Diphenhyd-DM-APAP [facility] SEVERE COLD & COUGH PO)  Take 1 packet by mouth daily as needed.    [provider]  predniSONE (DELTASONE) 10 MG tablet Take 2 tablets (20 mg total) by mouth daily. 01/04/18   Milton Ferguson, MD  traMADol (ULTRAM) 50 MG tablet Take 1 tablet (50 mg total) by mouth every 6 (six) hours as needed. 01/04/18   Milton Ferguson, MD    Family History Family History  Problem Relation Age of Onset  . Diabetes Mother   . Hypertension Mother     Social History Social History   Tobacco Use  . Smoking status: Never Smoker  . Smokeless tobacco: Never Used  Substance Use Topics  . Alcohol use: No  . Drug use: No     Allergies   Codeine; Latex; Lisinopril; and Lovastatin   Review of Systems Review of Systems  Constitutional: Negative for appetite change and fatigue.  HENT: Negative for congestion, ear discharge and sinus pressure.   Eyes: Negative for discharge.  Respiratory: Negative for cough.   Cardiovascular: Negative for chest pain.  Gastrointestinal: Negative for abdominal pain and diarrhea.  Genitourinary: Negative for frequency and hematuria.  Musculoskeletal: Positive for back pain.       Pain in right leg  Skin: Negative for rash.  Neurological: Negative for seizures, numbness and headaches.  Psychiatric/Behavioral: Negative for hallucinations.     Physical Exam Updated Vital Signs  BP (!) 163/82   Pulse 62   Temp 98.6 F (37 C) (Oral)   Resp 18   Ht 5\' 2"  (1.575 m)   Wt 70.8 kg (156 lb)   LMP 12/29/2012   SpO2 97%   BMI 28.53 kg/m   Physical Exam  Constitutional: She is oriented to person, place, and time. She appears well-developed.  HENT:  Head: Normocephalic.  Eyes: Conjunctivae and EOM are normal. No scleral icterus.  Neck: Neck supple. No thyromegaly present.  Cardiovascular: Normal rate and regular rhythm. Exam reveals no gallop and no friction rub.  No murmur heard. Pulmonary/Chest: No stridor. She has no wheezes. She has no rales. She exhibits no tenderness.    Abdominal: She exhibits no distension. There is no tenderness. There is no rebound.  Musculoskeletal: Normal range of motion. She exhibits no edema.  Mild to moderate lumbar spine tenderness.  Positive straight leg raise on the right  Lymphadenopathy:    She has no cervical adenopathy.  Neurological: She is oriented to person, place, and time. She exhibits normal muscle tone. Coordination normal.  Skin: No rash noted. No erythema.  Psychiatric: She has a normal mood and affect. Her behavior is normal.     ED Treatments / Results  Labs (all labs ordered are listed, but only abnormal results are displayed) Labs Reviewed - No data to display  EKG None  Radiology Dg Lumbar Spine Complete  Result Date: 01/04/2018 CLINICAL DATA:  Right leg pain. EXAM: LUMBAR SPINE - COMPLETE 4+ VIEW COMPARISON:  None. FINDINGS: There is no evidence of lumbar spine fracture. Minimal grade 1 anterolisthesis of L5 on S1 secondary to bilateral facet arthropathy. Degenerative disc disease with disc height loss at L4-5 and L5-S1 and to a much lesser extent L3-4. Severe bilateral facet arthropathy at L5-S1. Mild bilateral facet arthropathy at L4-5. IMPRESSION: 1. Lumbar spine spondylosis as described above. Electronically Signed   By: Kathreen Devoid   On: 01/04/2018 09:02    Procedures Procedures (including critical care time)  Medications Ordered in ED Medications  HYDROmorphone (DILAUDID) injection 1 mg (1 mg Intramuscular Given 01/04/18 0901)     Initial Impression / Assessment and Plan / ED Course  I have reviewed the triage vital signs and the nursing notes.  Pertinent labs & imaging results that were available during my care of the patient were reviewed by me and considered in my medical decision making (see chart for details).    Lumbar spine films show degenerative disease.  Patient will be treated with prednisone and Ultram. she will rest at home couple days and follow-up with orthopedics  Final  Clinical Impressions(s) / ED Diagnoses   Final diagnoses:  Acute midline low back pain with right-sided sciatica    ED Discharge Orders        Ordered    predniSONE (DELTASONE) 10 MG tablet  Daily     01/04/18 0921    traMADol (ULTRAM) 50 MG tablet  Every 6 hours PRN     01/04/18 7062       Milton Ferguson, MD 01/04/18 [phone]

## 2018-01-04 NOTE — ED Provider Notes (Signed)
[facility] DEPT Provider Note   CSN: [id number] Arrival date & time: 01/04/18  0758     History   Chief Complaint Chief Complaint  Patient presents with  . Leg Pain    HPI Brenda Peters is a 62 y.o. female.   Leg Pain      Past Medical History:  Diagnosis Date  . Asthma   . Carpal tunnel syndrome   . Heart murmur   . Heart palpitations   . High cholesterol   . Hypertension     There are no active problems to display for this patient.   Past Surgical History:  Procedure Laterality Date  . TUBAL LIGATION       OB History   None      Home Medications    Prior to Admission medications   Medication Sig Start Date End Date Taking? Authorizing Provider  amoxicillin-clavulanate (AUGMENTIN) 875-125 MG tablet Take 1 tablet by mouth every 12 (twelve) hours. 12/11/17   Wieters, Hallie C, PA-C  GuaiFENesin (MUCINEX MAXIMUM STRENGTH PO) Take 1 tablet by mouth daily as needed.    [provider]  ibuprofen (ADVIL,MOTRIN) 800 MG tablet Take 1 tablet (800 mg total) by mouth 3 (three) times daily. 12/11/17   Wieters, Hallie C, PA-C  loperamide (IMODIUM) 2 MG capsule Take 1 capsule (2 mg total) by mouth 4 (four) times daily as needed for diarrhea or loose stools. 04/04/16   Forde Dandy, MD  Phenyleph-Diphenhyd-DM-APAP [facility] SEVERE COLD & COUGH PO) Take 1 packet by mouth daily as needed.    [provider]  predniSONE (DELTASONE) 10 MG tablet Take 2 tablets (20 mg total) by mouth daily. 01/04/18   Milton Ferguson, MD  traMADol (ULTRAM) 50 MG tablet Take 1 tablet (50 mg total) by mouth every 6 (six) hours as needed. 01/04/18   Milton Ferguson, MD    Family History Family History  Problem Relation Age of Onset  . Diabetes Mother   . Hypertension Mother     Social History Social History   Tobacco Use  . Smoking status: Never Smoker  . Smokeless tobacco: Never Used  Substance Use Topics  . Alcohol use: No  . Drug  use: No     Allergies   Codeine; Latex; Lisinopril; and Lovastatin   Review of Systems Review of Systems   Physical Exam Updated Vital Signs BP (!) 156/90 (BP Location: Left Arm)   Pulse 70   Temp 98.6 F (37 C) (Oral)   Resp 18   Ht 5\' 2"  (1.575 m)   Wt 70.8 kg (156 lb)   LMP 12/29/2012   SpO2 97%   BMI 28.53 kg/m   Physical Exam   ED Treatments / Results  Labs (all labs ordered are listed, but only abnormal results are displayed) Labs Reviewed - No data to display  EKG None  Radiology Dg Lumbar Spine Complete  Result Date: 01/04/2018 CLINICAL DATA:  Right leg pain. EXAM: LUMBAR SPINE - COMPLETE 4+ VIEW COMPARISON:  None. FINDINGS: There is no evidence of lumbar spine fracture. Minimal grade 1 anterolisthesis of L5 on S1 secondary to bilateral facet arthropathy. Degenerative disc disease with disc height loss at L4-5 and L5-S1 and to a much lesser extent L3-4. Severe bilateral facet arthropathy at L5-S1. Mild bilateral facet arthropathy at L4-5. IMPRESSION: 1. Lumbar spine spondylosis as described above. Electronically Signed   By: Kathreen Devoid   On: 01/04/2018 09:02    Procedures Procedures (including critical  care time)  Medications Ordered in ED Medications  HYDROmorphone (DILAUDID) injection 1 mg (1 mg Intramuscular Given 01/04/18 0901)     Initial Impression / Assessment and Plan / ED Course  I have reviewed the triage vital signs and the nursing notes.  Pertinent labs & imaging results that were available during my care of the patient were reviewed by me and considered in my medical decision making (see chart for details).       Final Clinical Impressions(s) / ED Diagnoses   Final diagnoses:  Acute midline low back pain with right-sided sciatica    ED Discharge Orders        Ordered    predniSONE (DELTASONE) 10 MG tablet  Daily     01/04/18 0921    traMADol (ULTRAM) 50 MG tablet  Every 6 hours PRN     01/04/18 8657       Milton Ferguson, MD 01/08/18 1648

## 2018-01-04 NOTE — Discharge Instructions (Addendum)
Rest at home couple days.  Follow-up with Dr. Ihor Gully next week for recheck

## 2018-01-04 NOTE — ED Triage Notes (Signed)
Pt reports that she having right leg pain for couple weeks after [place] for several hours in steel toe boots. Pt saw [facility] and was given boot and followed up with specialty who gave injection. Pt states since Saturday having pain and swelling in right leg.

## 2018-01-04 NOTE — ED Notes (Signed)
Patient at Xray.

## 2018-01-06 ENCOUNTER — Ambulatory Visit: Payer: BC Managed Care – PPO | Admitting: Podiatry

## 2018-01-20 ENCOUNTER — Ambulatory Visit: Payer: BC Managed Care – PPO | Admitting: Podiatry

## 2018-02-12 ENCOUNTER — Ambulatory Visit: Payer: BC Managed Care – PPO | Admitting: Podiatry

## 2018-02-17 ENCOUNTER — Ambulatory Visit: Payer: BC Managed Care – PPO | Admitting: Podiatry

## 2018-04-09 ENCOUNTER — Ambulatory Visit: Payer: BC Managed Care – PPO | Admitting: Podiatry

## 2018-04-26 ENCOUNTER — Encounter: Payer: Self-pay | Admitting: Podiatry

## 2018-04-26 ENCOUNTER — Ambulatory Visit: Payer: BC Managed Care – PPO | Admitting: Podiatry

## 2018-04-26 ENCOUNTER — Telehealth: Payer: Self-pay | Admitting: *Deleted

## 2018-04-26 DIAGNOSIS — M79674 Pain in right toe(s): Secondary | ICD-10-CM | POA: Diagnosis not present

## 2018-04-26 DIAGNOSIS — T148XXA Other injury of unspecified body region, initial encounter: Secondary | ICD-10-CM

## 2018-04-26 DIAGNOSIS — M21619 Bunion of unspecified foot: Secondary | ICD-10-CM

## 2018-04-26 DIAGNOSIS — B351 Tinea unguium: Secondary | ICD-10-CM

## 2018-04-26 DIAGNOSIS — L600 Ingrowing nail: Secondary | ICD-10-CM

## 2018-04-26 DIAGNOSIS — M79675 Pain in left toe(s): Secondary | ICD-10-CM

## 2018-04-26 NOTE — Telephone Encounter (Signed)
Orders faxed to [facility] Scheduling, then given to Gretta Arab, RN.

## 2018-04-28 NOTE — Progress Notes (Signed)
Subjective:   Patient ID: Brenda Peters, female   DOB: 62 y.o.   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 that is painful   ROS      Objective:  Physical Exam  Neurovascular status intact with weakness of the peroneal tendon right with swelling of the tendon near its insertion base of fifth metatarsal with thick yellow brittle nailbeds 1-5 both feet that are painful with chronic pain of the second third fourth left over right     Assessment:  Possibility for tear of the peroneal tendon right with mycotic nail infection and chronic nail disease along with structural bunion deformity bilateral that is painful     Plan:  H&P all conditions reviewed and at this point I debrided nailbeds 1-5 both feet to reduce all pressure discussed the chronic tendon injury right and we are going to get an MRI of this to rule out tear and I went ahead and I discussed permanent procedures for the nails that will be done in the future along with possible bunion correction depending on the results of the perineal MRI that will be done.  Reappoint when we get the results

## 2018-05-04 ENCOUNTER — Other Ambulatory Visit: Payer: Self-pay

## 2018-05-04 ENCOUNTER — Encounter (HOSPITAL_COMMUNITY): Payer: Self-pay

## 2018-05-04 ENCOUNTER — Emergency Department (HOSPITAL_COMMUNITY)
Admission: EM | Admit: 2018-05-04 | Discharge: 2018-05-04 | Disposition: A | Discharge status: Home / Self Care | Payer: BC Managed Care – PPO | Attending: Emergency Medicine | Admitting: Emergency Medicine

## 2018-05-04 ENCOUNTER — Emergency Department (HOSPITAL_COMMUNITY): Payer: BC Managed Care – PPO

## 2018-05-04 DIAGNOSIS — R0602 Shortness of breath: Secondary | ICD-10-CM | POA: Insufficient documentation

## 2018-05-04 DIAGNOSIS — Z5321 Procedure and treatment not carried out due to patient leaving prior to being seen by health care provider: Secondary | ICD-10-CM | POA: Insufficient documentation

## 2018-05-04 DIAGNOSIS — R079 Chest pain, unspecified: Secondary | ICD-10-CM | POA: Diagnosis not present

## 2018-05-04 NOTE — ED Triage Notes (Signed)
Pt states that she has had cold like symptoms x 1 week. Pt states that it has gotten worse in the last few days. Pt states that she has taken thera flu, mucinex, nyquil without relief. Pt states that she started having chest pain this morning.  Pt also c/o mild shortness of breath.

## 2018-05-04 NOTE — ED Notes (Signed)
Bed: WA18 Expected date:  Expected time:  Means of arrival:  Comments: Hold per charge 

## 2018-05-05 ENCOUNTER — Emergency Department (HOSPITAL_COMMUNITY)
Admission: EM | Admit: 2018-05-05 | Discharge: 2018-05-05 | Disposition: A | Discharge status: Home / Self Care | Payer: BC Managed Care – PPO | Attending: Emergency Medicine | Admitting: Emergency Medicine

## 2018-05-05 ENCOUNTER — Ambulatory Visit (HOSPITAL_COMMUNITY): Payer: BC Managed Care – PPO

## 2018-05-05 ENCOUNTER — Encounter (HOSPITAL_COMMUNITY): Payer: Self-pay | Admitting: *Deleted

## 2018-05-05 ENCOUNTER — Other Ambulatory Visit: Payer: Self-pay

## 2018-05-05 DIAGNOSIS — J069 Acute upper respiratory infection, unspecified: Secondary | ICD-10-CM | POA: Insufficient documentation

## 2018-05-05 DIAGNOSIS — R0789 Other chest pain: Secondary | ICD-10-CM | POA: Insufficient documentation

## 2018-05-05 DIAGNOSIS — B9789 Other viral agents as the cause of diseases classified elsewhere: Secondary | ICD-10-CM

## 2018-05-05 DIAGNOSIS — R05 Cough: Secondary | ICD-10-CM | POA: Diagnosis present

## 2018-05-05 LAB — CBC WITH DIFFERENTIAL/PLATELET
Abs Immature Granulocytes: 0.01 10*3/uL (ref 0.00–0.07)
BASOS ABS: 0 10*3/uL (ref 0.0–0.1)
Basophils Relative: 1 %
EOS ABS: 0.2 10*3/uL (ref 0.0–0.5)
EOS PCT: 3 %
HEMATOCRIT: 48.1 % — AB (ref 36.0–46.0)
Hemoglobin: 15.1 g/dL — ABNORMAL HIGH (ref 12.0–15.0)
IMMATURE GRANULOCYTES: 0 %
Lymphocytes Relative: 28 %
Lymphs Abs: 1.5 10*3/uL (ref 0.7–4.0)
MCH: 29.3 pg (ref 26.0–34.0)
MCHC: 31.4 g/dL (ref 30.0–36.0)
MCV: 93.2 fL (ref 80.0–100.0)
Monocytes Absolute: 0.3 10*3/uL (ref 0.1–1.0)
Monocytes Relative: 5 %
NEUTROS PCT: 63 %
NRBC: 0 % (ref 0.0–0.2)
Neutro Abs: 3.3 10*3/uL (ref 1.7–7.7)
PLATELETS: 257 10*3/uL (ref 150–400)
RBC: 5.16 MIL/uL — AB (ref 3.87–5.11)
RDW: 12.1 % (ref 11.5–15.5)
WBC: 5.4 10*3/uL (ref 4.0–10.5)

## 2018-05-05 LAB — I-STAT TROPONIN, ED: TROPONIN I, POC: 0 ng/mL (ref 0.00–0.08)

## 2018-05-05 LAB — BASIC METABOLIC PANEL
ANION GAP: 4 — AB (ref 5–15)
BUN: 14 mg/dL (ref 6–20)
CALCIUM: 9.9 mg/dL (ref 8.9–10.3)
CHLORIDE: 110 mmol/L (ref 98–111)
CO2: 26 mmol/L (ref 22–32)
Creatinine, Ser: 0.94 mg/dL (ref 0.44–1.00)
GFR calc non Af Amer: >60 mL/min (ref >60)
Glucose, Bld: 99 mg/dL (ref 70–99)
Potassium: 4 mmol/L (ref 3.5–5.1)
SODIUM: 140 mmol/L (ref 135–145)

## 2018-05-05 MED ORDER — NAPROXEN 500 MG PO TABS: 500.0000 mg | ORAL_TABLET | Freq: Two times a day (BID) | ORAL | 0 refills | Status: DC

## 2018-05-05 MED ORDER — FLUTICASONE PROPIONATE 50 MCG/ACT NA SUSP: 1.0000 | Freq: Every day | NASAL | 2 refills | Status: DC

## 2018-05-05 MED ORDER — BENZONATATE 100 MG PO CAPS: 200.0000 mg | ORAL_CAPSULE | Freq: Three times a day (TID) | ORAL | 0 refills | Status: DC

## 2018-05-05 MED ORDER — CETIRIZINE HCL 5 MG PO TABS: 5.0000 mg | ORAL_TABLET | Freq: Every day | ORAL | 0 refills | Status: DC

## 2018-05-05 NOTE — ED Notes (Signed)
Signature pad not working verbalized understanding of discharge instructions.  

## 2018-05-05 NOTE — ED Triage Notes (Signed)
Pt in c/o cough and congestion, pt thinks she had a flu, pt seen at [facility] yesterday for same, came back in today due to same symptoms, no distress noted

## 2018-05-05 NOTE — ED Provider Notes (Signed)
[facility] Provider Note   CSN: [id number] Arrival date & time: 05/05/18  1127     History   Chief Complaint Chief Complaint  Patient presents with  . Cough    HPI Brenda Peters is a 62 y.o. female with a past medical history of hypertension, hyperlipidemia who presents to ED for 2-week history of dry cough, nasal congestion, chest pain.  States that she had several episodes of emesis and diarrhea 2 days ago which have improved.  She has been taking TheraFlu, DayQuil and NyQuil, Mucinex with only mild improvement in her symptoms.  She believes the chest pain is from her coughing but has chest pain with any type of movement or no movement at all.  Denies any hemoptysis, leg swelling, history of MI, PE or DVT, sick contacts, fever.  HPI  Past Medical History:  Diagnosis Date  . Asthma   . Carpal tunnel syndrome   . Heart murmur   . Heart palpitations   . High cholesterol   . Hypertension     There are no active problems to display for this patient.   Past Surgical History:  Procedure Laterality Date  . TUBAL LIGATION       OB History   None      Home Medications    Prior to Admission medications   Medication Sig Start Date End Date Taking? Authorizing Provider  amoxicillin-clavulanate (AUGMENTIN) 875-125 MG tablet Take 1 tablet by mouth every 12 (twelve) hours. 12/11/17   Wieters, Hallie C, PA-C  benzonatate (TESSALON) 100 MG capsule Take 2 capsules (200 mg total) by mouth every 8 (eight) hours. 05/05/18   Azure Budnick, PA-C  cetirizine (ZYRTEC) 5 MG tablet Take 1 tablet (5 mg total) by mouth daily. 05/05/18   Ashish Rossetti, PA-C  fluticasone (FLONASE) 50 MCG/ACT nasal spray Place 1 spray into both nostrils daily. 05/05/18   Kaicee Scarpino, PA-C  GuaiFENesin (MUCINEX MAXIMUM STRENGTH PO) Take 1 tablet by mouth daily as needed.    [provider]  ibuprofen (ADVIL,MOTRIN) 800 MG tablet Take 1 tablet (800 mg total) by  mouth 3 (three) times daily. 12/11/17   Wieters, Hallie C, PA-C  loperamide (IMODIUM) 2 MG capsule Take 1 capsule (2 mg total) by mouth 4 (four) times daily as needed for diarrhea or loose stools. 04/04/16   Forde Dandy, MD  naproxen (NAPROSYN) 500 MG tablet Take 1 tablet (500 mg total) by mouth 2 (two) times daily. 05/05/18   Alexes Menchaca, PA-C  Phenyleph-Diphenhyd-DM-APAP (THERAFLU SEVERE COLD & COUGH PO) Take 1 packet by mouth daily as needed.    [provider]  predniSONE (DELTASONE) 10 MG tablet Take 2 tablets (20 mg total) by mouth daily. 01/04/18   Milton Ferguson, MD  traMADol (ULTRAM) 50 MG tablet Take 1 tablet (50 mg total) by mouth every 6 (six) hours as needed. 01/04/18   Milton Ferguson, MD    Family History Family History  Problem Relation Age of Onset  . Diabetes Mother   . Hypertension Mother     Social History Social History   Tobacco Use  . Smoking status: Never Smoker  . Smokeless tobacco: Never Used  Substance Use Topics  . Alcohol use: No  . Drug use: No     Allergies   Codeine; Latex; Lisinopril; and Lovastatin   Review of Systems Review of Systems  Constitutional: Positive for chills. Negative for appetite change and fever.  HENT: Positive for congestion and rhinorrhea.  Negative for ear pain, sneezing and sore throat.   Eyes: Negative for photophobia and visual disturbance.  Respiratory: Positive for cough. Negative for chest tightness, shortness of breath and wheezing.   Cardiovascular: Positive for chest pain. Negative for palpitations.  Gastrointestinal: Negative for abdominal pain, blood in stool, constipation, diarrhea, nausea and vomiting.  Genitourinary: Negative for dysuria, hematuria and urgency.  Musculoskeletal: Positive for myalgias.  Skin: Negative for rash.  Neurological: Negative for dizziness, weakness and light-headedness.     Physical Exam Updated Vital Signs BP (!) 146/104   Pulse 87   Temp 98 F (36.7 C) (Oral)    Resp 18   LMP 12/29/2012   SpO2 97%   Physical Exam  Constitutional: She appears well-developed and well-nourished. No distress.  HENT:  Head: Normocephalic and atraumatic.  Nose: Nose normal.  Eyes: Conjunctivae and EOM are normal. Right eye exhibits no discharge. Left eye exhibits no discharge. No scleral icterus.  Neck: Normal range of motion. Neck supple.  Cardiovascular: Normal rate, regular rhythm, normal heart sounds and intact distal pulses. Exam reveals no gallop and no friction rub.  No murmur heard. Pulmonary/Chest: Effort normal and breath sounds normal. No respiratory distress. She exhibits tenderness.  Abdominal: Soft. Bowel sounds are normal. She exhibits no distension. There is no tenderness. There is no guarding.  Musculoskeletal: Normal range of motion. She exhibits no edema.  Neurological: She is alert. She exhibits normal muscle tone. Coordination normal.  Skin: Skin is warm and dry. No rash noted.  Psychiatric: She has a normal mood and affect.  Nursing note and vitals reviewed.    ED Treatments / Results  Labs (all labs ordered are listed, but only abnormal results are displayed) Labs Reviewed  BASIC METABOLIC PANEL - Abnormal; Notable for the following components:      Result Value   Anion gap 4 (*)    All other components within normal limits  CBC WITH DIFFERENTIAL/PLATELET - Abnormal; Notable for the following components:   RBC 5.16 (*)    Hemoglobin 15.1 (*)    HCT 48.1 (*)    All other components within normal limits  I-STAT TROPONIN, ED    EKG EKG Interpretation  Date/Time:  Wednesday May 05 2018 12:18:45 EDT Ventricular Rate:  75 PR Interval:  146 QRS Duration: 86 QT Interval:  364 QTC Calculation: 406 R Axis:   25 Text Interpretation:  Sinus rhythm with sinus arrhythmia with occasional Premature ventricular complexes Otherwise normal ECG since last tracing no significant change Confirmed by Daleen Bo [id number]) on 05/05/2018 1:03:05  PM   Radiology Dg Chest 2 View  Result Date: 05/04/2018 CLINICAL DATA:  Chest pain EXAM: CHEST - 2 VIEW COMPARISON:  04/04/2016 FINDINGS: The heart size and mediastinal contours are within normal limits. Both lungs are clear. The visualized skeletal structures are unremarkable. IMPRESSION: No active cardiopulmonary disease. Electronically Signed   By: Kathreen Devoid   On: 05/04/2018 11:13    Procedures Procedures (including critical care time)  Medications Ordered in ED Medications - No data to display   Initial Impression / Assessment and Plan / ED Course  I have reviewed the triage vital signs and the nursing notes.  Pertinent labs & imaging results that were available during my care of the patient were reviewed by me and considered in my medical decision making (see chart for details).     62 year old female with past medical history of hypertension, hyperlipidemia presents to ED for 2-week history of dry cough, nasal congestion,  chest pain.  She reports that 2 days ago, she had several episodes of nonbloody, nonbilious emesis and diarrhea which has since resolved.  On exam chest pain is reproducible with palpation.  She is nontoxic-appearing.  She is not tachycardic, tachypneic or hypoxic.  Denies history of MI, PE.  Lab work including CBC, BMP, troponin unremarkable.  Chest x-ray done at [facility] yesterday shows no acute abnormalities.  She left without being seen yesterday.  EKG shows tracing similar to prior.  Suspect that symptoms are viral in nature.  Treat symptomatically for viral URI with antitussives, anti-inflammatories and antihistamines.  Will advise her to return to ED for any severe worsening symptoms.  Portions of this note were generated with [employer]. Dictation errors may occur despite best attempts at proofreading.   Final Clinical Impressions(s) / ED Diagnoses   Final diagnoses:  Chest wall pain  Viral URI with cough    ED  Discharge Orders         Ordered    naproxen (NAPROSYN) 500 MG tablet  2 times daily     05/05/18 1320    benzonatate (TESSALON) 100 MG capsule  Every 8 hours     05/05/18 1320    fluticasone (FLONASE) 50 MCG/ACT nasal spray  Daily     05/05/18 1320    cetirizine (ZYRTEC) 5 MG tablet  Daily     05/05/18 1320           Delia Heady, PA-C 05/05/18 1322    Daleen Bo, MD 05/05/18 1756

## 2018-05-05 NOTE — Discharge Instructions (Signed)
Return to ED for worsening symptoms, increased chest pain, shortness of breath, vomiting or coughing up blood, abdominal pain.

## 2018-05-10 ENCOUNTER — Ambulatory Visit: Payer: BC Managed Care – PPO | Admitting: Podiatry

## 2018-05-14 ENCOUNTER — Ambulatory Visit (HOSPITAL_COMMUNITY)
Admission: RE | Admit: 2018-05-14 | Discharge: 2018-05-14 | Disposition: A | Discharge status: Home / Self Care | Payer: BC Managed Care – PPO | Source: Ambulatory Visit | Attending: Podiatry | Admitting: Podiatry

## 2018-05-17 ENCOUNTER — Encounter (HOSPITAL_COMMUNITY): Payer: Self-pay

## 2018-05-17 ENCOUNTER — Ambulatory Visit (HOSPITAL_COMMUNITY)
Admission: RE | Admit: 2018-05-17 | Discharge: 2018-05-17 | Disposition: A | Discharge status: Home / Self Care | Payer: BC Managed Care – PPO | Source: Ambulatory Visit | Attending: Podiatry | Admitting: Podiatry

## 2018-05-20 ENCOUNTER — Ambulatory Visit: Payer: BC Managed Care – PPO | Admitting: Podiatry

## 2018-05-21 ENCOUNTER — Ambulatory Visit: Payer: BC Managed Care – PPO | Admitting: Family Medicine

## 2018-08-13 ENCOUNTER — Other Ambulatory Visit: Payer: Self-pay

## 2018-08-13 ENCOUNTER — Ambulatory Visit (HOSPITAL_COMMUNITY)
Admission: EM | Admit: 2018-08-13 | Discharge: 2018-08-13 | Disposition: A | Discharge status: Home / Self Care | Payer: BC Managed Care – PPO | Attending: Family Medicine | Admitting: Family Medicine

## 2018-08-13 ENCOUNTER — Ambulatory Visit (INDEPENDENT_AMBULATORY_CARE_PROVIDER_SITE_OTHER): Payer: BC Managed Care – PPO

## 2018-08-13 ENCOUNTER — Encounter (HOSPITAL_COMMUNITY): Payer: Self-pay | Admitting: Emergency Medicine

## 2018-08-13 DIAGNOSIS — M25562 Pain in left knee: Secondary | ICD-10-CM

## 2018-08-13 MED ORDER — PREDNISONE 10 MG (21) PO TBPK: ORAL_TABLET | ORAL | 0 refills | Status: DC

## 2018-08-13 NOTE — ED Triage Notes (Signed)
PT reports left knee pain that started yesterday after her knee "locked up" while walking to the car. PT reports knee is swollen and pain extends into thigh and calf.

## 2018-08-13 NOTE — ED Provider Notes (Signed)
[facility]    CSN: [id number] Arrival date & time: 08/13/18  0802     History   Chief Complaint Chief Complaint  Patient presents with  . Knee Pain    HPI Brenda Peters is a 63 y.o. female.   Patient is a 63 year old female who presents with left knee pain.  This is been present and worsening over the past week.  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  She describes it as her knee locking up on her.  She is now having some swelling and pain surrounding her entire knee anterior and posteriorly.  Reports that the pain radiates down her leg.  She denies any injuries to the knee or any previous problems in the knee.  She usually walks daily.  Denies any history of blood clots.  Denies any fevers, chills, body aches.  She took 1 tramadol last night for her pain.  She had this medicine leftover from shoulder problem.  She is having trouble bearing weight on that left leg.  No erythema, increased warmth, calf swelling.  ROS per HPI      Past Medical History:  Diagnosis Date  . Asthma   . Carpal tunnel syndrome   . Heart murmur   . Heart palpitations   . High cholesterol   . Hypertension     There are no active problems to display for this patient.   Past Surgical History:  Procedure Laterality Date  . TUBAL LIGATION      OB History   No obstetric history on file.      Home Medications    Prior to Admission medications   Medication Sig Start Date End Date Taking? Authorizing Provider  fluticasone (FLONASE) 50 MCG/ACT nasal spray Place 1 spray into both nostrils daily. 05/05/18  Yes Khatri, Hina, PA-C  traMADol (ULTRAM) 50 MG tablet Take 1 tablet (50 mg total) by mouth every 6 (six) hours as needed. 01/04/18  Yes Milton Ferguson, MD  cetirizine (ZYRTEC) 5 MG tablet Take 1 tablet (5 mg total) by mouth daily. 05/05/18   Khatri, Hina, PA-C  ibuprofen (ADVIL,MOTRIN) 800 MG tablet Take 1 tablet (800 mg total) by mouth 3 (three) times daily.  12/11/17   Wieters, Hallie C, PA-C  naproxen (NAPROSYN) 500 MG tablet Take 1 tablet (500 mg total) by mouth 2 (two) times daily. 05/05/18   Khatri, Hina, PA-C  predniSONE (STERAPRED UNI-PAK 21 TAB) 10 MG (21) TBPK tablet 6 tabs for 1 day, then 5 tabs for 1 das, then 4 tabs for 1 day, then 3 tabs for 1 day, 2 tabs for 1 day, then 1 tab for 1 day 08/13/18   Orvan July, NP    Family History Family History  Problem Relation Age of Onset  . Diabetes Mother   . Hypertension Mother     Social History Social History   Tobacco Use  . Smoking status: Never Smoker  . Smokeless tobacco: Never Used  Substance Use Topics  . Alcohol use: No  . Drug use: No     Allergies   Codeine; Latex; Lisinopril; and Lovastatin   Review of Systems Review of Systems   Physical Exam Triage Vital Signs ED Triage Vitals [08/13/18 0818]  Enc Vitals Group     BP (!) 163/93     Pulse Rate 83     Resp 16     Temp 98.1 F (36.7 C)     Temp Source Oral  SpO2 97 %     Weight      Height      Head Circumference      Peak Flow      Pain Score 9     Pain Loc      Pain Edu?      Excl. in [place]?    No data found.  Updated Vital Signs BP (!) 163/93   Pulse 83   Temp 98.1 F (36.7 C) (Oral)   Resp 16   LMP 12/29/2012   SpO2 97%   Visual Acuity Right Eye Distance:   Left Eye Distance:   Bilateral Distance:    Right Eye Near:   Left Eye Near:    Bilateral Near:     Physical Exam Vitals signs and nursing note reviewed.  Constitutional:      General: She is not in acute distress.    Appearance: She is well-developed.  HENT:     Head: Normocephalic and atraumatic.  Eyes:     Conjunctiva/sclera: Conjunctivae normal.  Neck:     Musculoskeletal: Neck supple.  Cardiovascular:     Rate and Rhythm: Normal rate and regular rhythm.     Heart sounds: No murmur.  Pulmonary:     Effort: Pulmonary effort is normal. No respiratory distress.     Breath sounds: Normal breath sounds.    Abdominal:     Palpations: Abdomen is soft.     Tenderness: There is no abdominal tenderness.  Musculoskeletal:        General: Swelling and tenderness present. No signs of injury.     Right lower leg: No edema.     Left lower leg: No edema.     Comments: Mild tenderness to the entire anterior and posterior patella. Mild swelling anteriorly.  Tender to anterior and posterior lower extremity.  No swelling, bruising, erythema or deformities. No calf swelling.   Skin:    General: Skin is warm and dry.  Neurological:     Mental Status: She is alert.  Psychiatric:        Mood and Affect: Mood normal.      UC Treatments / Results  Labs (all labs ordered are listed, but only abnormal results are displayed) Labs Reviewed - No data to display  EKG None  Radiology Dg Knee Complete 4 Views Left  Result Date: 08/13/2018 CLINICAL DATA:  Left knee pain with locking. Limited range of motion. Stiffness and redness and swelling. EXAM: LEFT KNEE - COMPLETE 4+ VIEW COMPARISON:  Radiographs dated 05/03/2014 FINDINGS: No evidence of fracture or dislocation. There is a joint effusion. There is chondrocalcinosis. Degenerative calcifications in the distal quadriceps tendon. Minimal osteophyte formation on the upper pole of the patella. IMPRESSION: 1. Chondrocalcinosis. 2. New joint effusion. 3. Slight arthritic changes of the patella. Electronically Signed   By: Lorriane Shire M.D.   On: 08/13/2018 08:52    Procedures Procedures (including critical care time)  Medications Ordered in UC Medications - No data to display  Initial Impression / Assessment and Plan / UC Course  I have reviewed the triage vital signs and the nursing notes.  Pertinent labs & imaging results that were available during my care of the patient were reviewed by me and considered in my medical decision making (see chart for details).     X ray revealed chondrocalcinosis with new joint effusion and arthritis Will go ahead  and treat with ace wrap for swelling.  Prednisone taper for inflammation and swelling.  Rest, ice, elevate Instructed to follow up with her orthopedist if not seeing improvement in symptoms by monday.  Pt understanding and agreed.   Final Clinical Impressions(s) / UC Diagnoses   Final diagnoses:  Acute pain of left knee     Discharge Instructions     We will be treating you with prednisone taper for the next 6 days. Take this daily with food. We are placing an Ace wrap on your knee here in clinic.  Please wear this it will help with the swelling Rest, ice and elevate the leg If you are still experiencing symptoms next week please follow-up with your orthopedic    ED Prescriptions    Medication Sig Dispense Auth. Provider   predniSONE (STERAPRED UNI-PAK 21 TAB) 10 MG (21) TBPK tablet 6 tabs for 1 day, then 5 tabs for 1 das, then 4 tabs for 1 day, then 3 tabs for 1 day, 2 tabs for 1 day, then 1 tab for 1 day 21 tablet Rozanna Box, Agape Hardiman A, NP     Controlled Substance Prescriptions [place] Controlled Substance Registry consulted? Not Applicable   Orvan July, NP 08/13/18 [phone]

## 2018-08-13 NOTE — ED Triage Notes (Signed)
PT has taken tramadol at home.

## 2018-08-13 NOTE — Discharge Instructions (Addendum)
We will be treating you with prednisone taper for the next 6 days. Take this daily with food. We are placing an Ace wrap on your knee here in clinic.  Please wear this it will help with the swelling Rest, ice and elevate the leg If you are still experiencing symptoms next week please follow-up with your orthopedic

## 2018-08-17 ENCOUNTER — Encounter (HOSPITAL_COMMUNITY): Payer: Self-pay | Admitting: *Deleted

## 2018-08-17 ENCOUNTER — Ambulatory Visit (HOSPITAL_COMMUNITY)
Admission: EM | Admit: 2018-08-17 | Discharge: 2018-08-17 | Disposition: A | Discharge status: Home / Self Care | Payer: BC Managed Care – PPO | Attending: Internal Medicine | Admitting: Internal Medicine

## 2018-08-17 DIAGNOSIS — G8929 Other chronic pain: Secondary | ICD-10-CM

## 2018-08-17 DIAGNOSIS — M25562 Pain in left knee: Secondary | ICD-10-CM

## 2018-08-17 NOTE — ED Provider Notes (Signed)
[place] [facility]    CSN: [id number] Arrival date & time: 08/17/18  1501     History   Chief Complaint Chief Complaint  Patient presents with  . Knee Pain    HPI Brenda Peters is a 63 y.o. female with a history of asthma comes to the urgent care today with complaints of worsening left knee pain.  Patient was previously seen here on 08/13/2018 and was prescribed short course of prednisone.  The left knee pain transiently got better but seems to be worsening now.  Pain is fairly severe at this time.  Is currently associated with some swelling on the inner side of the knee as well as the lateral side of the knee.  She complains of some clicking noises in the knee especially when she is climbing stairs or descending stairs.  Knee pain is worsening with climbing up the stairs and disturbing her distance.  Currently no relieving factors.  No redness of the knee.  Patient admits to mild swelling.  HPI  Past Medical History:  Diagnosis Date  . Asthma   . Carpal tunnel syndrome   . Heart murmur   . Heart palpitations   . High cholesterol   . Hypertension     There are no active problems to display for this patient.   Past Surgical History:  Procedure Laterality Date  . TUBAL LIGATION      OB History   No obstetric history on file.      Home Medications    Prior to Admission medications   Medication Sig Start Date End Date Taking? Authorizing Provider  ibuprofen (ADVIL,MOTRIN) 800 MG tablet Take 1 tablet (800 mg total) by mouth 3 (three) times daily. 12/11/17  Yes Wieters, Hallie C, PA-C  predniSONE (STERAPRED UNI-PAK 21 TAB) 10 MG (21) TBPK tablet 6 tabs for 1 day, then 5 tabs for 1 das, then 4 tabs for 1 day, then 3 tabs for 1 day, 2 tabs for 1 day, then 1 tab for 1 day 08/13/18  Yes Bast, Traci A, NP  cetirizine (ZYRTEC) 5 MG tablet Take 1 tablet (5 mg total) by mouth daily. 05/05/18   Delia Heady, PA-C    Family History Family History  Problem Relation Age of  Onset  . Diabetes Mother   . Hypertension Mother     Social History Social History   Tobacco Use  . Smoking status: Never Smoker  . Smokeless tobacco: Never Used  Substance Use Topics  . Alcohol use: No  . Drug use: No     Allergies   Codeine; Latex; Lisinopril; and Lovastatin   Review of Systems Review of Systems  Respiratory: Negative for chest tightness and shortness of breath.   Cardiovascular: Negative for chest pain and palpitations.  Musculoskeletal: Positive for arthralgias, joint swelling and myalgias.  Allergic/Immunologic: Negative for environmental allergies and food allergies.  Neurological: Negative for dizziness, tremors, weakness and headaches.  Hematological: Negative for adenopathy.     Physical Exam Triage Vital Signs ED Triage Vitals  Enc Vitals Group     BP 08/17/18 1615 (!) 175/91     Pulse Rate 08/17/18 1615 71     Resp 08/17/18 1615 16     Temp 08/17/18 1615 98.1 F (36.7 C)     Temp Source 08/17/18 1615 Oral     SpO2 08/17/18 1615 97 %     Weight --      Height --      Head Circumference --  Peak Flow --      Pain Score 08/17/18 1616 10     Pain Loc --      Pain Edu? --      Excl. in [place]? --    No data found.  Updated Vital Signs BP (!) 175/91 (BP Location: Right Arm)   Pulse 71   Temp 98.1 F (36.7 C) (Oral)   Resp 16   LMP 12/29/2012   SpO2 97%   Visual Acuity Right Eye Distance:   Left Eye Distance:   Bilateral Distance:    Right Eye Near:   Left Eye Near:    Bilateral Near:     Physical Exam Constitutional:      Appearance: Normal appearance. She is normal weight.  Abdominal:     General: There is no distension.     Tenderness: There is no abdominal tenderness.  Musculoskeletal:        General: Swelling and tenderness present. No deformity.     Right lower leg: No edema.     Left lower leg: No edema.     Comments: Tenderness to palpation on the medial aspect of the left knee.  Limited range of motion.   Anterior and posterior drawer signs are unremarkable.  Palpation over the medial meniscus/medial collateral ligament is tender.  Skin:    Capillary Refill: Capillary refill takes less than 2 seconds.  Neurological:     General: No focal deficit present.     Mental Status: She is alert and oriented to person, place, and time.     Cranial Nerves: No cranial nerve deficit.     Sensory: No sensory deficit.     Motor: No weakness.     Gait: Gait normal.     Deep Tendon Reflexes: Reflexes normal.      UC Treatments / Results  Labs (all labs ordered are listed, but only abnormal results are displayed) Labs Reviewed - No data to display  EKG None  Radiology No results found.  Procedures Procedures (including critical care time)  Medications Ordered in UC Medications - No data to display  Initial Impression / Assessment and Plan / UC Course  I have reviewed the triage vital signs and the nursing notes.  Pertinent labs & imaging results that were available during my care of the patient were reviewed by me and considered in my medical decision making (see chart for details).     1.  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 Patient to call orthopedic surgery for appointment  Final Clinical Impressions(s) / UC Diagnoses   Final diagnoses:  Chronic pain of left knee   Discharge Instructions   None    ED Prescriptions    None     Controlled Substance Prescriptions [place] Controlled Substance Registry consulted? No   Chase Picket, MD 08/17/18 [phone]

## 2018-08-17 NOTE — ED Triage Notes (Signed)
Patient reports left knee pain, states was seen for same on her visit on 1/31. Patient reports pain increased Sunday night. Patient reports swelling. Has compression on. States that she is taking prescription as prescribed-prednisone. Patient is a Secretary/administrator. Rest during breaks.

## 2019-04-23 IMAGING — CR DG LUMBAR SPINE COMPLETE 4+V
5 series · 5 of 5 positions shown · non-contrast
Comparison: None.

CLINICAL DATA: Right leg pain.

EXAM:
LUMBAR SPINE - COMPLETE 4+ VIEW

[t lumbar spine ap]
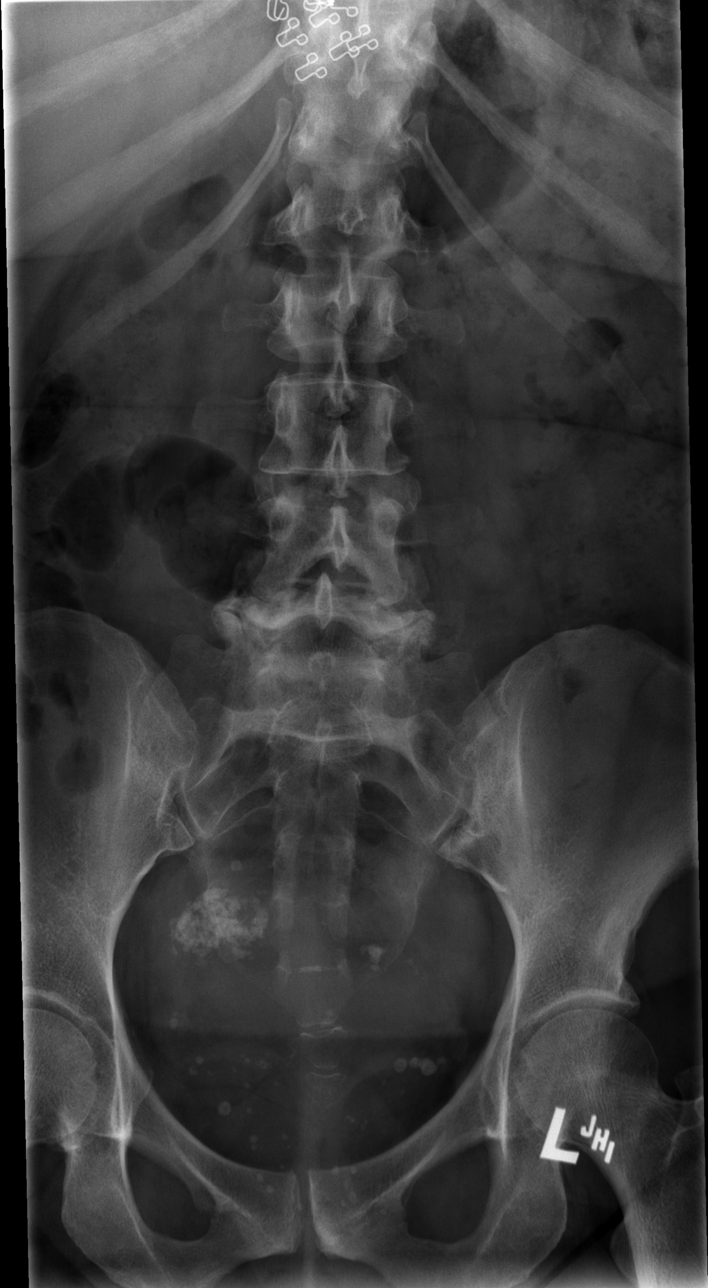

[t lumbar spine obl (1 of 2)]
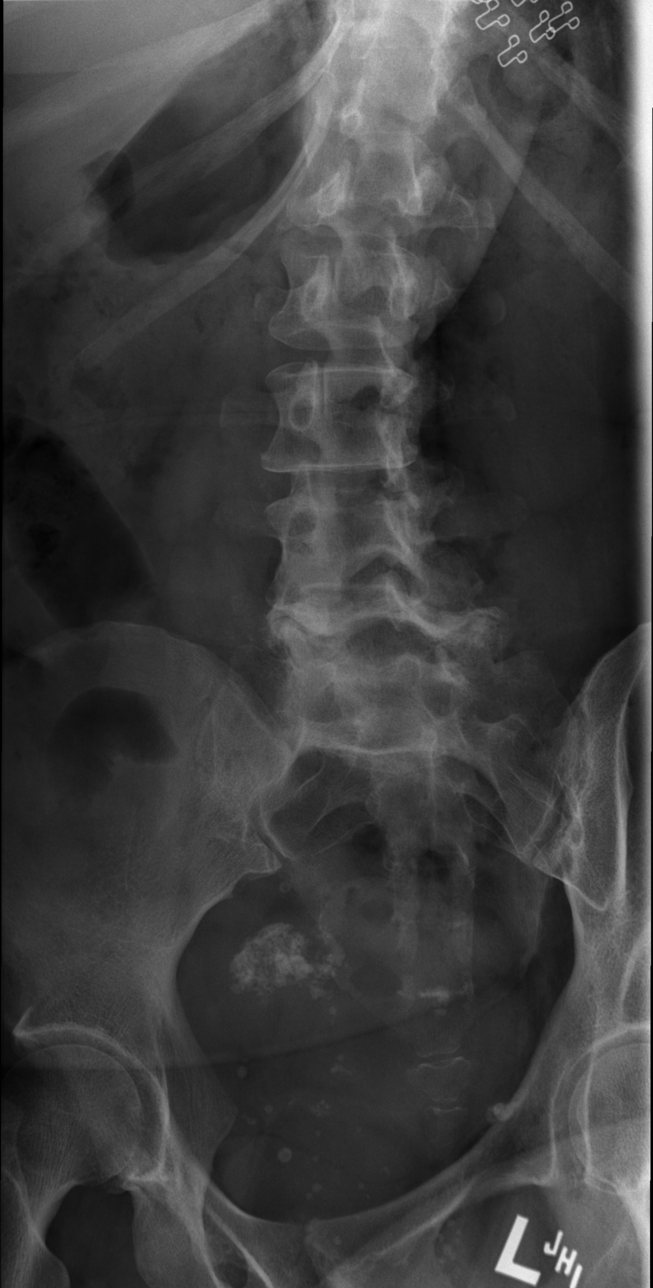

[t lumbar spine obl (2 of 2)]
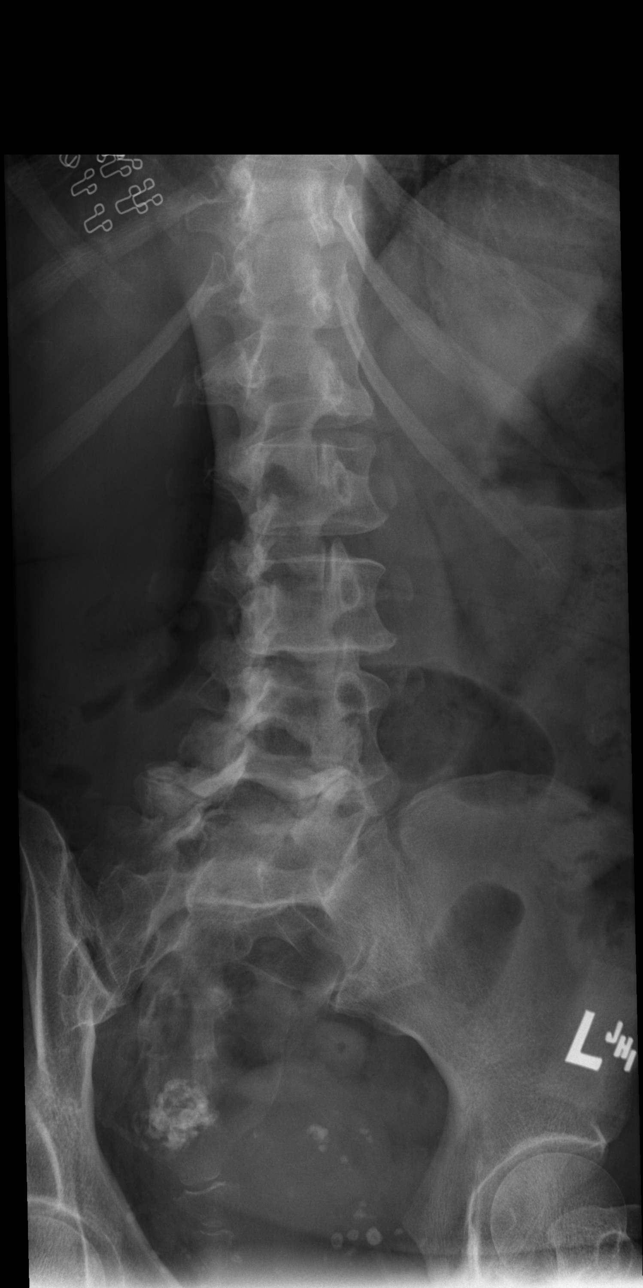

[t lumbar spine lat]
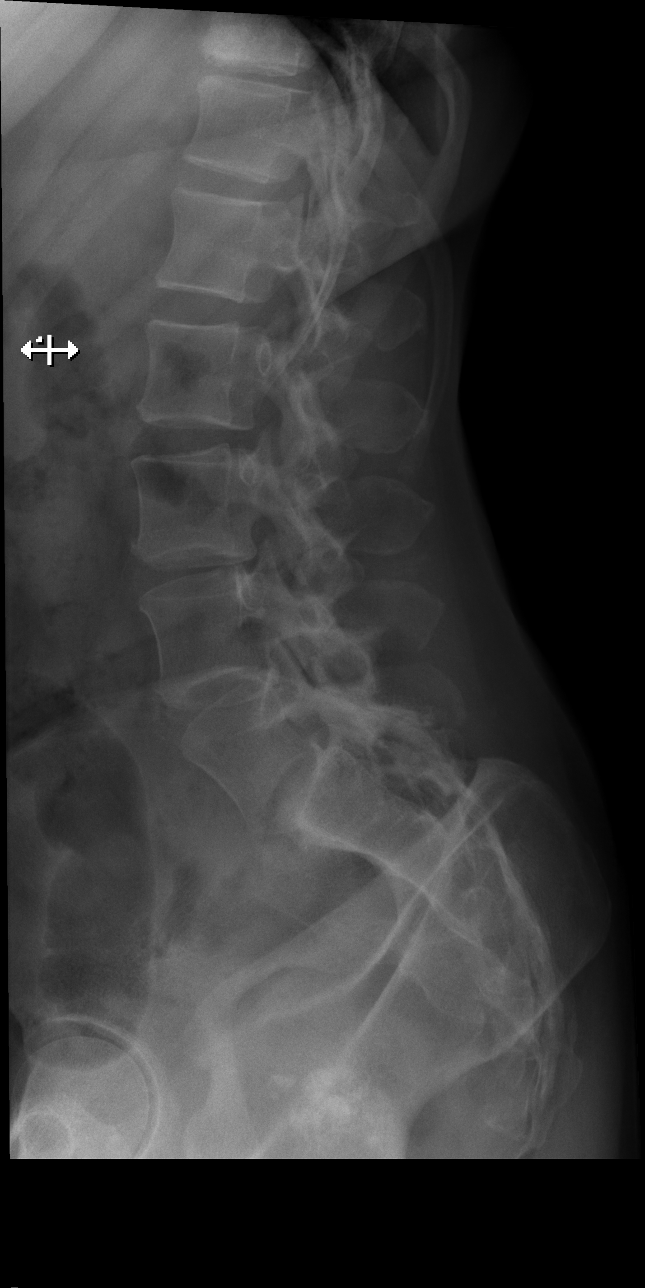

[t lumbar l-5 s-1 spot]
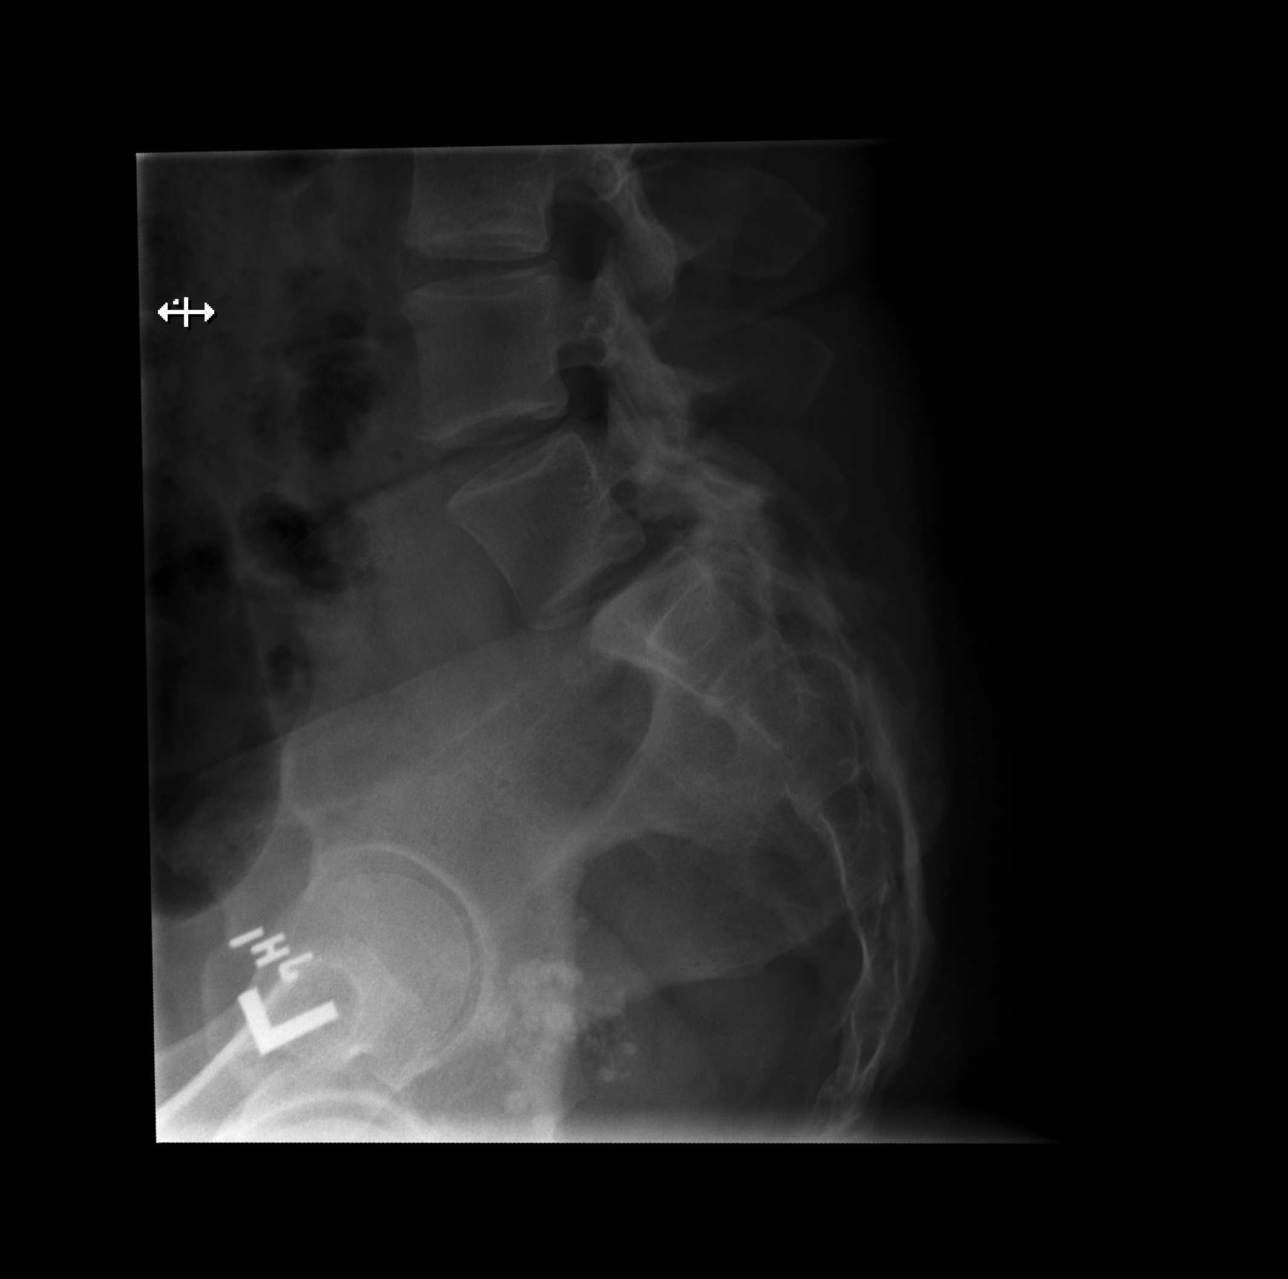

[5 of 5 positions shown; findings below may reference images not displayed]

FINDINGS: There is no evidence of lumbar spine fracture. Minimal grade 1
anterolisthesis of L5 on S1 secondary to bilateral facet
arthropathy. Degenerative disc disease with disc height loss at L4-5
and L5-S1 and to a much lesser extent L3-4. Severe bilateral facet
arthropathy at L5-S1. Mild bilateral facet arthropathy at L4-5.
IMPRESSION: 1. Lumbar spine spondylosis as described above.

## 2019-05-31 ENCOUNTER — Encounter: Payer: Self-pay | Admitting: Family Medicine

## 2019-05-31 ENCOUNTER — Ambulatory Visit: Payer: BC Managed Care – PPO | Admitting: Family Medicine

## 2019-05-31 ENCOUNTER — Ambulatory Visit (INDEPENDENT_AMBULATORY_CARE_PROVIDER_SITE_OTHER): Payer: BC Managed Care – PPO

## 2019-05-31 ENCOUNTER — Other Ambulatory Visit: Payer: Self-pay

## 2019-05-31 VITALS — BP 142/84 | HR 90 | Temp 98.9°F | Wt 158.8 lb

## 2019-05-31 DIAGNOSIS — I493 Ventricular premature depolarization: Secondary | ICD-10-CM

## 2019-05-31 DIAGNOSIS — Z8249 Family history of ischemic heart disease and other diseases of the circulatory system: Secondary | ICD-10-CM

## 2019-05-31 DIAGNOSIS — M25532 Pain in left wrist: Secondary | ICD-10-CM

## 2019-05-31 DIAGNOSIS — I10 Essential (primary) hypertension: Secondary | ICD-10-CM | POA: Diagnosis not present

## 2019-05-31 DIAGNOSIS — R0789 Other chest pain: Secondary | ICD-10-CM

## 2019-05-31 DIAGNOSIS — Z131 Encounter for screening for diabetes mellitus: Secondary | ICD-10-CM

## 2019-05-31 NOTE — Progress Notes (Signed)
Established Patient Office Visit  Subjective:  Patient ID: Brenda Peters, female    DOB: 1956-01-30  Age: 63 y.o. MRN: [id number]  CC:  Chief Complaint  Patient presents with  . Establish Care    New patient here to est care. Left hand stiffness and pain. Hard to make a fist. Have been using compression gloves help a little    HPI Brenda Peters presents for   Overweight Body mass index is 29.04 kg/m.  Hypertension: Patient here for follow-up of elevated blood pressure. She is exercising by walking at work and is adherent to low salt diet.  Blood pressure is well controlled at home. Cardiac symptoms chest pain and chest pressure/discomfort. Patient denies dyspnea, fatigue, irregular heart beat, near-syncope, orthopnea and palpitations.  Cardiovascular risk factors: dyslipidemia and hypertension. Use of agents associated with hypertension: none. History of target organ damage: none.  BP Readings from Last 3 Encounters:  05/31/19 (!) 142/84  08/17/18 (!) 175/91  08/13/18 (!) 163/93    Chest Pain/Chest Discomfort  She reports that she had some chest pain and dull ache that lasted a few minutes for about 3 days. She reports that the pain was intermittent  The pain occurred while she was at rest It was non-radiating She does not taking any aspirin  Past Medical History:  Diagnosis Date  . Asthma   . Carpal tunnel syndrome   . Heart murmur   . Heart palpitations   . High cholesterol   . Hypertension     Past Surgical History:  Procedure Laterality Date  . TUBAL LIGATION      Family History  Problem Relation Age of Onset  . Diabetes Mother   . Hypertension Mother     Social History   Socioeconomic History  . Marital status: Single    Spouse name: Not on file  . Number of children: Not on file  . Years of education: Not on file  . Highest education level: Not on file  Occupational History  . Not on file  Social Needs  . Financial resource strain: Not  on file  . Food insecurity    Worry: Not on file    Inability: Not on file  . Transportation needs    Medical: Not on file    Non-medical: Not on file  Tobacco Use  . Smoking status: Never Smoker  . Smokeless tobacco: Never Used  Substance and Sexual Activity  . Alcohol use: No  . Drug use: No  . Sexual activity: Not on file  Lifestyle  . Physical activity    Days per week: Not on file    Minutes per session: Not on file  . Stress: Not on file  Relationships  . Social Herbalist on phone: Not on file    Gets together: Not on file    Attends religious service: Not on file    Active member of club or organization: Not on file    Attends meetings of clubs or organizations: Not on file    Relationship status: Not on file  . Intimate partner violence    Fear of current or ex partner: Not on file    Emotionally abused: Not on file    Physically abused: Not on file    Forced sexual activity: Not on file  Other Topics Concern  . Not on file  Social History Narrative  . Not on file    Outpatient Medications Prior to Visit  Medication  Sig Dispense Refill  . cyanocobalamin ([facility] VITAMIN B12) 1000 MCG tablet Take 1 tablet by mouth daily.    . cetirizine (ZYRTEC) 5 MG tablet Take 1 tablet (5 mg total) by mouth daily. 15 tablet 0  . ibuprofen (ADVIL,MOTRIN) 800 MG tablet Take 1 tablet (800 mg total) by mouth 3 (three) times daily. 21 tablet 0  . predniSONE (STERAPRED UNI-PAK 21 TAB) 10 MG (21) TBPK tablet 6 tabs for 1 day, then 5 tabs for 1 das, then 4 tabs for 1 day, then 3 tabs for 1 day, 2 tabs for 1 day, then 1 tab for 1 day 21 tablet 0   No facility-administered medications prior to visit.     Allergies  Allergen Reactions  . Codeine Palpitations, Other (See Comments) and Shortness Of Breath    Cardiac arrest////shortness of breath  . Latex Itching and Rash  . Lisinopril Cough  . Lovastatin Other (See Comments)    Cramps    ROS Review of Systems Review of  Systems  Constitutional: Negative for activity change, appetite change, chills and fever.  HENT: Negative for congestion, nosebleeds, trouble swallowing and voice change.   Respiratory: Negative for cough, shortness of breath and wheezing.   Gastrointestinal: Negative for diarrhea, nausea and vomiting.  Genitourinary: Negative for difficulty urinating, dysuria, flank pain and hematuria.  Musculoskeletal: Negative for back pain, joint swelling and neck pain.  Neurological: Negative for dizziness, speech difficulty, light-headedness and numbness.  See HPI. All other review of systems negative.     Objective:    Physical Exam  BP (!) 142/84   Pulse 90   Temp 98.9 F (37.2 C) (Oral)   Wt 158 lb 12.8 oz (72 kg)   LMP 12/29/2012   SpO2 99%   BMI 29.04 kg/m  Wt Readings from Last 3 Encounters:  06/08/19 159 lb (72.1 kg)  05/31/19 158 lb 12.8 oz (72 kg)  05/04/18 165 lb (74.8 kg)   Physical Exam  Constitutional: Oriented to person, place, and time. Appears well-developed and well-nourished.  HENT:  Head: Normocephalic and atraumatic.  Eyes: Conjunctivae and EOM are normal.  Cardiovascular: Normal rate, regular rhythm, normal heart sounds and intact distal pulses.  No murmur heard. Pulmonary/Chest: Effort normal and breath sounds normal. No stridor. No respiratory distress. Has no wheezes.  Neurological: Is alert and oriented to person, place, and time.  Skin: Skin is warm. Capillary refill takes less than 2 seconds.  Psychiatric: Has a normal mood and affect. Behavior is normal. Judgment and thought content normal.   ECG - NSR, No st changes or twi  There are no preventive care reminders to display for this patient.  There are no preventive care reminders to display for this patient.  Lab Results  Component Value Date   TSH 4.760 (H) 05/31/2019   Lab Results  Component Value Date   WBC 6.9 05/31/2019   HGB 13.7 05/31/2019   HCT 40.7 05/31/2019   MCV 91 05/31/2019    PLT 223 05/31/2019   Lab Results  Component Value Date   NA 143 05/31/2019   K 4.1 05/31/2019   CO2 20 05/31/2019   GLUCOSE 108 (H) 05/31/2019   BUN 13 05/31/2019   CREATININE 0.92 05/31/2019   BILITOT 0.6 05/31/2019   ALKPHOS 63 05/31/2019   AST 15 05/31/2019   ALT 10 05/31/2019   PROT 6.8 05/31/2019   ALBUMIN 4.4 05/31/2019   CALCIUM 10.0 05/31/2019   ANIONGAP 4 (L) 05/05/2018   Lab Results  Component Value Date   CHOL 243 (H) 05/31/2019   Lab Results  Component Value Date   HDL 75 05/31/2019   Lab Results  Component Value Date   LDLCALC 142 (H) 05/31/2019   Lab Results  Component Value Date   TRIG 146 05/31/2019   Lab Results  Component Value Date   CHOLHDL 3.2 05/31/2019   Lab Results  Component Value Date   HGBA1C 5.9 (H) 05/31/2019      Assessment & Plan:   Problem List Items Addressed This Visit      Cardiovascular and Mediastinum   HTN (hypertension)  - informed patient of blood pressure goal of 139/89 or less. Condition is stable. Continue current medications and treatment plan. I recommend that you exercise for 30-45 minutes 5 days a week. I also recommend a balanced diet with fruits and vegetables every day, lean meats, and little fried foods. The DASH diet (you can find this online) is a good example of this.    Relevant Orders   EKG 12-Lead (Completed)   CMP14+EGFR (Completed)   Lipid panel (Completed)     Other   Family history of premature CAD   Relevant Orders   EKG 12-Lead (Completed)    Other Visit Diagnoses    Dull chest pain    -  Primary Chest pain could be GERD or musculoskeletal   Relevant Orders   EKG 12-Lead (Completed)   Frequent PVCs    -  ecg reassuring Advised increased hydration   Relevant Orders   CBC (Completed)   TSH (Completed)   Screening for diabetes mellitus       Relevant Orders   Hemoglobin A1c (Completed)   Left wrist pain    - osteopenia, no arthritis, advised Vitamin D and weight bearing exercises    Relevant Orders   DG Wrist Complete Left (Completed)      No orders of the defined types were placed in this encounter.   Follow-up: Return in about 3 months (around 08/31/2019) for weight check.   A total of 45  minutes were spent face-to-face with the patient during this encounter and over half of that time was spent on counseling and coordination of care.   Forrest Moron, MD

## 2019-05-31 NOTE — Patient Instructions (Addendum)
 Low Glycemic Foods (20-49)  Breakfast Cereals: All-Bran                All-Bran Fruit ' n Oats Fiber One               Oatmeal (not instant)  Oat bran Fruits and fruit juices: (Limit to 1-2 servings per day) Apples               Apricots (fresh & dried)  Blackberries            Blueberries Cherries                  Cranberries             Peaches                  Pears                       Plums                       Prunes Grapefruit                Raspberries            Strawberries           Tangerine Apple juice             Grapefruit juice Tomato juice  Beans and legumes (fresh-cooked): Black-eyed peas     Butter beans Chick peas              Lentils     Green beans           Lima beans               Kidney beans          Navy beans  Non-starchy vegetables: Asparagus, bok choy, broccoli, cabbage, cauliflower, celery, cucumber, greens, lettuce, mushrooms, peppers, tomatoes, okra, onions, snow peas, spinach, summer squash  Grains: Barley                                Bulgur Rye                                    Wild rice  Nuts and oils : Almonds         Peanuts     Sunflower seeds  Hazelnuts      Pecans          Walnuts Oils that are liquid at room temperature  Dairy, fish, and meat: Milk, skim                         Lowfat cheese Yogurt, lowfat, fruit sugar sweetened Lean red meat                      Fish  Skinless chicken & turkey    Shellfish Moderate Glycemic Foods (50-69)  Breakfast Cereals: Bran Buds                             Bran Chex Just Right                              Mini-Wheats Special K         Swiss muesli  Fruits: Banana (under-ripe)             Dates Figs                                      Grapes Kiwi                                      Mango Oranges                               Raisins  Fruit Juices: Cranberry juice                    Orange juice  Beans and legumes: [place]-type baked beans Canned pinto, kidney,  or navy beans Green peas  Vegetables: Beets                         Raw Carrots  Sweet potato              Yam Corn on the cob  Breads: Pita (pocket) bread          Oat bran bread Pumpernickel bread           Rye bread Wheat bread, high fiber       Grains: Cornmeal                           Rice, brown   Rice, white                         Couscous Pasta: Macaroni                           Pizza  cheese Raviolimeat filled           Spaghetti, white        Nuts: Cashews                           Macadamia  Snacks: Chocolate                    Ice cream,lowfat  Muffin                               Popcorn High Glycemic Foods (70-100)   Breakfast Cereals: Cheerios                 Corn Chex Corn Flakes            Cream of Wheat Grape Nuts              Grape Nut Flakes Life                 Nutri-Grain       Puffed Rice               Puffed Wheat Rice Chex                 Rice Krispies Shredded Wheat               Team Total Fruits: Pineapple                 Watermelon Banana (over-ripe)  Beverages: Sodas, sweet tea, pineapple juice  Vegetables: Potato, baked, boiled, fried, mashed French fries Canned or frozen corn Cooked carrots Parsnips Winter squash  Breads: Most breads (white and whole grain) Bagels                     Bread sticks Bread stuffing          Kaiser roll Dinner rolls  Grains: Rice, instant          Tapioca, with milk  Candy and most cookies Snacks: Donuts                      Corn chips        Jelly beans                 Pretzels Pastries                             Restaurant and ethnic foods Most Chinese food (sugar in stir fry or wok sauces) Teriyaki-style meats and vegetables     If you have lab work done today you will be contacted with your lab results within the next 2 weeks.  If you have not heard from us then please contact us. The fastest way to get your results is to register for My Chart.   IF you received an  x-ray today, you will receive an invoice from [facility]. Please contact [facility] at [phone] with questions or concerns regarding your invoice.   IF you received labwork today, you will receive an invoice from [facility]. Please contact [facility] at [phone] with questions or concerns regarding your invoice.   Our billing staff will not be able to assist you with questions regarding bills from these companies.  You will be contacted with the lab results as soon as they are available. The fastest way to get your results is to activate your My Chart account. Instructions are located on the last page of this paperwork. If you have not heard from us regarding the results in 2 weeks, please contact this office.     

## 2019-06-01 LAB — CMP14+EGFR
ALT: 10 IU/L (ref 0–32)
AST: 15 IU/L (ref 0–40)
Albumin/Globulin Ratio: 1.8 (ref 1.2–2.2)
Albumin: 4.4 g/dL (ref 3.8–4.8)
Alkaline Phosphatase: 63 IU/L (ref 39–117)
BUN/Creatinine Ratio: 14 (ref 12–28)
BUN: 13 mg/dL (ref 8–27)
Bilirubin Total: 0.6 mg/dL (ref 0.0–1.2)
CO2: 20 mmol/L (ref 20–29)
Calcium: 10 mg/dL (ref 8.7–10.3)
Chloride: 105 mmol/L (ref 96–106)
Creatinine, Ser: 0.92 mg/dL (ref 0.57–1.00)
GFR calc Af Amer: 78 mL/min/{1.73_m2} (ref >59)
GFR calc non Af Amer: 67 mL/min/{1.73_m2} (ref >59)
Globulin, Total: 2.4 g/dL (ref 1.5–4.5)
Glucose: 108 mg/dL — ABNORMAL HIGH (ref 65–99)
Potassium: 4.1 mmol/L (ref 3.5–5.2)
Sodium: 143 mmol/L (ref 134–144)
Total Protein: 6.8 g/dL (ref 6.0–8.5)

## 2019-06-01 LAB — CBC
Hematocrit: 40.7 % (ref 34.0–46.6)
Hemoglobin: 13.7 g/dL (ref 11.1–15.9)
MCH: 30.6 pg (ref 26.6–33.0)
MCHC: 33.7 g/dL (ref 31.5–35.7)
MCV: 91 fL (ref 79–97)
Platelets: 223 10*3/uL (ref 150–450)
RBC: 4.47 x10E6/uL (ref 3.77–5.28)
RDW: 12.8 % (ref 11.7–15.4)
WBC: 6.9 10*3/uL (ref 3.4–10.8)

## 2019-06-01 LAB — LIPID PANEL
Chol/HDL Ratio: 3.2 ratio (ref 0.0–4.4)
Cholesterol, Total: 243 mg/dL — ABNORMAL HIGH (ref 100–199)
HDL: 75 mg/dL (ref >39)
LDL Chol Calc (NIH): 142 mg/dL — ABNORMAL HIGH (ref 0–99)
Triglycerides: 146 mg/dL (ref 0–149)
VLDL Cholesterol Cal: 26 mg/dL (ref 5–40)

## 2019-06-01 LAB — HEMOGLOBIN A1C
Est. average glucose Bld gHb Est-mCnc: 123 mg/dL
Hgb A1c MFr Bld: 5.9 % — ABNORMAL HIGH (ref 4.8–5.6)

## 2019-06-01 LAB — TSH: TSH: 4.76 u[IU]/mL — ABNORMAL HIGH (ref 0.450–4.500)

## 2019-06-08 ENCOUNTER — Other Ambulatory Visit: Payer: Self-pay

## 2019-06-08 ENCOUNTER — Telehealth (INDEPENDENT_AMBULATORY_CARE_PROVIDER_SITE_OTHER): Payer: BC Managed Care – PPO | Admitting: Family Medicine

## 2019-06-08 VITALS — Wt 159.0 lb

## 2019-06-08 DIAGNOSIS — M25532 Pain in left wrist: Secondary | ICD-10-CM

## 2019-06-08 DIAGNOSIS — F1593 Other stimulant use, unspecified with withdrawal: Secondary | ICD-10-CM

## 2019-06-08 DIAGNOSIS — M85842 Other specified disorders of bone density and structure, left hand: Secondary | ICD-10-CM

## 2019-06-08 DIAGNOSIS — F1523 Other stimulant dependence with withdrawal: Secondary | ICD-10-CM | POA: Diagnosis not present

## 2019-06-08 DIAGNOSIS — R519 Headache, unspecified: Secondary | ICD-10-CM | POA: Diagnosis not present

## 2019-06-08 MED ORDER — CETIRIZINE HCL 10 MG PO TABS: 10.0000 mg | ORAL_TABLET | Freq: Every day | ORAL | 11 refills | Status: DC

## 2019-06-08 MED ORDER — MOMETASONE FUROATE 50 MCG/ACT NA SUSP: 1.0000 | Freq: Every day | NASAL | 2 refills | Status: DC

## 2019-06-08 MED ORDER — FLUTICASONE PROPIONATE 50 MCG/ACT NA SUSP: 2.0000 | Freq: Every day | NASAL | 6 refills | Status: DC

## 2019-06-08 MED ORDER — IBUPROFEN 800 MG PO TABS: 800.0000 mg | ORAL_TABLET | Freq: Three times a day (TID) | ORAL | 1 refills | Status: DC | PRN

## 2019-06-08 NOTE — Progress Notes (Signed)
No travel outside the [place] or [place] in the past 3 weeks, no falls and pt denies any symptoms of depression.

## 2019-06-08 NOTE — Progress Notes (Signed)
Doximity Encounter- SOAP NOTE Established Patient  This video encounter was conducted with the patient's (or proxy's) verbal consent via audio telecommunications: yes/no: Yes Patient was instructed to have this encounter in a suitably private space; and to only have persons present to whom they give permission to participate. In addition, patient identity was confirmed by use of name plus two identifiers (DOB and address).  I discussed the limitations, risks, security and privacy concerns of performing an evaluation and management service by telephone and the availability of in person appointments. I also discussed with the patient that there may be a patient responsible charge related to this service. The patient expressed understanding and agreed to proceed.  I spent a total of TIME; 0 MIN TO 60 MIN: 25 minutes talking with the patient or their proxy.  Chief Complaint  Patient presents with  . Headache    per pt no headache right now but is concerned as her dad died of an aneurysm  . Sinusitis    x 2 weeks, c/o pain in top of head, sides of head, cheek bones, front of hea dand ears.  Taking aleve 12 hr for pain, took cold med yesterday and it helped with ha.  Worsening and really bad on 05/31/19  . left hand pain    pain continues, pain level today 7/10 but usually is 10/10, aleve for pain with no relief, hand is swollen  and she uses her hand repetitively daily and needs some relief.    Subjective   Brenda Peters is a 63 y.o. established patient. Telephone visit today for  HPI   Headache that feels like pressure  She has pressure in the cheek bones Her nose is stopped up  She took some otc cold and flu She feels a continued pressure in her forehead She also gave up caffeine since the last visit  Left hand continues to hurt Tylenol and advil 200mg  don't help She uses her hands a lot     Patient Active Problem List   Diagnosis Date Noted  . Ankle pain, chronic  09/21/2013  . Dry eye syndrome 06/02/2013  . Family history of premature CAD 07/26/2012  . Carpal tunnel syndrome on right 01/21/2011  . HTN (hypertension) 01/21/2011    Past Medical History:  Diagnosis Date  . Asthma   . Carpal tunnel syndrome   . Heart murmur   . Heart palpitations   . High cholesterol   . Hypertension     Current Outpatient Medications  Medication Sig Dispense Refill  . cyanocobalamin ([facility] VITAMIN B12) 1000 MCG tablet Take 1 tablet by mouth daily.    . cetirizine (ZYRTEC) 10 MG tablet Take 1 tablet (10 mg total) by mouth daily. 30 tablet 11  . ibuprofen (ADVIL) 800 MG tablet Take 1 tablet (800 mg total) by mouth every 8 (eight) hours as needed. Take with food 30 tablet 1  . mometasone (NASONEX) 50 MCG/ACT nasal spray Place 1 spray into the nose daily. 17 g 2   No current facility-administered medications for this visit.     Allergies  Allergen Reactions  . Codeine Palpitations, Other (See Comments) and Shortness Of Breath    Cardiac arrest////shortness of breath  . Latex Itching and Rash  . Lisinopril Cough  . Lovastatin Other (See Comments)    Cramps    Social History   Socioeconomic History  . Marital status: Single    Spouse name: Not on file  . Number of children: Not  on file  . Years of education: Not on file  . Highest education level: Not on file  Occupational History  . Not on file  Social Needs  . Financial resource strain: Not on file  . Food insecurity    Worry: Not on file    Inability: Not on file  . Transportation needs    Medical: Not on file    Non-medical: Not on file  Tobacco Use  . Smoking status: Never Smoker  . Smokeless tobacco: Never Used  Substance and Sexual Activity  . Alcohol use: No  . Drug use: No  . Sexual activity: Not on file  Lifestyle  . Physical activity    Days per week: Not on file    Minutes per session: Not on file  . Stress: Not on file  Relationships  . Social Herbalist on  phone: Not on file    Gets together: Not on file    Attends religious service: Not on file    Active member of club or organization: Not on file    Attends meetings of clubs or organizations: Not on file    Relationship status: Not on file  . Intimate partner violence    Fear of current or ex partner: Not on file    Emotionally abused: Not on file    Physically abused: Not on file    Forced sexual activity: Not on file  Other Topics Concern  . Not on file  Social History Narrative  . Not on file    ROS Review of Systems  Constitutional: Negative for activity change, appetite change, chills and fever.  HENT: Negative for congestion, nosebleeds, trouble swallowing and voice change.   Respiratory: Negative for cough, shortness of breath and wheezing.   Gastrointestinal: Negative for diarrhea, nausea and vomiting.  Genitourinary: Negative for difficulty urinating, dysuria, flank pain and hematuria.  Musculoskeletal: Negative for back pain, joint swelling and neck pain.  Neurological: Negative for dizziness, speech difficulty, light-headedness and numbness. See hpi  See HPI. All other review of systems negative.   Objective   Vitals as reported by the patient: Today's Vitals   06/08/19 0856  Weight: 159 lb (72.1 kg)   Physical Exam  Constitutional: Oriented to person, place, and time. Appears well-developed and well-nourished.  HENT:  Head: Normocephalic and atraumatic.  Eyes: Conjunctivae and EOM are normal.  Pulmonary/Chest: Effort normal. No respiratory distress.  Neurological: Is alert and oriented to person, place, and time.  Skin: Skin is warm. Capillary refill takes less than 2 seconds.  Psychiatric: Has a normal mood and affect. Behavior is normal. Judgment and thought content normal.   [place] was seen today for headache, sinusitis and left hand pain.  Diagnoses and all orders for this visit:  Sinus headache- likely sinus congestion and caffeine withdrawal Discussed  aneurysm and warning signs since pt was worried meds prescribed as below -     mometasone (NASONEX) 50 MCG/ACT nasal spray; Place 1 spray into the nose daily. -     cetirizine (ZYRTEC) 10 MG tablet; Take 1 tablet (10 mg total) by mouth daily. -     ibuprofen (ADVIL) 800 MG tablet; Take 1 tablet (800 mg total) by mouth every 8 (eight) hours as needed. Take with food  Caffeine withdrawal (HCC) -     ibuprofen (ADVIL) 800 MG tablet; Take 1 tablet (800 mg total) by mouth every 8 (eight) hours as needed. Take with food  Osteopenia of left hand  Left wrist pain -     Ambulatory referral to Physical Therapy Advised ibuprofen for headache and hand pain Other orders -     Discontinue: fluticasone (FLONASE) 50 MCG/ACT nasal spray; Place 2 sprays into both nostrils daily.     I discussed the assessment and treatment plan with the patient. The patient was provided an opportunity to ask questions and all were answered. The patient agreed with the plan and demonstrated an understanding of the instructions.   The patient was advised to call back or seek an in-person evaluation if the symptoms worsen or if the condition fails to improve as anticipated.  I provided 25 minutes of face time on doximity time during this encounter.  Reviewed labs and xray with patient.  Forrest Moron, MD  Primary Care at [facility]

## 2019-06-08 NOTE — Patient Instructions (Signed)
° ° ° °  If you have lab work done today you will be contacted with your lab results within the next 2 weeks.  If you have not heard from us then please contact us. The fastest way to get your results is to register for My Chart. ° ° °IF you received an x-ray today, you will receive an invoice from [facility]. Please contact [facility] at [phone] with questions or concerns regarding your invoice.  ° °IF you received labwork today, you will receive an invoice from [facility]. Please contact [facility] at [phone] with questions or concerns regarding your invoice.  ° °Our billing staff will not be able to assist you with questions regarding bills from these companies. ° °You will be contacted with the lab results as soon as they are available. The fastest way to get your results is to activate your My Chart account. Instructions are located on the last page of this paperwork. If you have not heard from us regarding the results in 2 weeks, please contact this office. °  ° ° ° °

## 2019-08-22 ENCOUNTER — Other Ambulatory Visit: Payer: Self-pay

## 2019-08-22 ENCOUNTER — Ambulatory Visit (INDEPENDENT_AMBULATORY_CARE_PROVIDER_SITE_OTHER): Payer: BC Managed Care – PPO | Admitting: Family Medicine

## 2019-08-22 ENCOUNTER — Encounter: Payer: Self-pay | Admitting: Family Medicine

## 2019-08-22 ENCOUNTER — Ambulatory Visit (INDEPENDENT_AMBULATORY_CARE_PROVIDER_SITE_OTHER): Payer: BC Managed Care – PPO

## 2019-08-22 VITALS — BP 150/82 | HR 78 | Temp 98.3°F | Ht 60.0 in | Wt 158.0 lb

## 2019-08-22 DIAGNOSIS — R7303 Prediabetes: Secondary | ICD-10-CM

## 2019-08-22 DIAGNOSIS — I10 Essential (primary) hypertension: Secondary | ICD-10-CM

## 2019-08-22 DIAGNOSIS — Z1211 Encounter for screening for malignant neoplasm of colon: Secondary | ICD-10-CM

## 2019-08-22 DIAGNOSIS — M11242 Other chondrocalcinosis, left hand: Secondary | ICD-10-CM

## 2019-08-22 DIAGNOSIS — M25642 Stiffness of left hand, not elsewhere classified: Secondary | ICD-10-CM | POA: Diagnosis not present

## 2019-08-22 LAB — POCT GLYCOSYLATED HEMOGLOBIN (HGB A1C): Hemoglobin A1C: 6 % — AB (ref 4.0–5.6)

## 2019-08-22 NOTE — Patient Instructions (Addendum)
If you have lab work done today you will be contacted with your lab results within the next 2 weeks.  If you have not heard from [place] then please contact us. The fastest way to get your results is to register for My Chart.   IF you received an x-ray today, you will receive an invoice from [facility]. Please contact [facility] at [phone] with questions or concerns regarding your invoice.   IF you received labwork today, you will receive an invoice from [facility]. Please contact [facility] at [phone] with questions or concerns regarding your invoice.   Our billing staff will not be able to assist you with questions regarding bills from these companies.  You will be contacted with the lab results as soon as they are available. The fastest way to get your results is to activate your My Chart account. Instructions are located on the last page of this paperwork. If you have not heard from [place] regarding the results in 2 weeks, please contact this office.     Colorectal Cancer Screening  Colorectal cancer screening is a group of tests that are used to check for colorectal cancer before symptoms develop. Colorectal refers to the colon and rectum. The colon and rectum are located at the end of the digestive tract and carry bowel movements out of the body. Who should have screening? All adults starting at age 62 until age 62 should have screening. Your health care provider may recommend screening at age 62. You will have tests every 1-10 years, depending on your results and the type of screening test. You may have screening tests starting at an earlier age, or more frequently than other people, if you have any of the following risk factors:  A personal or family history of colorectal cancer or abnormal growths (polyps).  Inflammatory bowel disease, such as ulcerative colitis or Crohn's disease.  A history of having radiation treatment to the abdomen or pelvic area for  cancer.  Colorectal cancer symptoms, such as changes in bowel habits or blood in your stool.  A type of colon cancer syndrome that is passed from parent to child (hereditary), such as: ? Lynch syndrome. ? Familial adenomatous polyposis. ? Turcot syndrome. ? Peutz-Jeghers syndrome. Screening recommendations for adults who are 62-62 years old vary depending on health. How is screening done? There are several types of colorectal screening tests. You may have one or more of the following:  Guaiac-based fecal occult blood testing. For this test, a stool (feces) sample is checked for hidden (occult) blood, which could be a sign of colorectal cancer.  Fecal immunochemical test (FIT). For this test, a stool sample is checked for blood, which could be a sign of colorectal cancer.  Stool DNA test. For this test, a stool sample is checked for blood and changes in DNA that could lead to colorectal cancer.  Sigmoidoscopy. During this test, a thin, flexible tube with a camera on the end (sigmoidoscope) is used to examine the rectum and the lower colon.  Colonoscopy. During this test, a long, flexible tube with a camera on the end (colonoscope) is used to examine the entire colon and rectum. With a colonoscopy, it is possible to take a sample of tissue (biopsy) and remove small polyps during the test.  Virtual colonoscopy. Instead of a colonoscope, this type of colonoscopy uses X-rays (CT scan) and computers to produce images of the colon and rectum. What are the benefits of screening? Screening reduces your risk  for colorectal cancer and can help identify cancer at an early stage, when the cancer can be removed or treated more easily. It is common for polyps to form in the lining of the colon, especially as you age. These polyps may be cancerous or become cancerous over time. Screening can identify these polyps. What are the risks of screening? Each screening test may have different risks.  Stool  sample tests have fewer risks than other types of screening tests. However, you may need more tests to confirm results from a stool sample test.  Screening tests that involve X-rays expose you to low levels of radiation, which may slightly increase your cancer risk. The benefit of detecting cancer outweighs the slight increase in risk.  Screening tests such as sigmoidoscopy and colonoscopy may place you at risk for bleeding, intestinal damage, infection, or a reaction to medicines given during the exam. Talk with your health care provider to understand your risk for colorectal cancer and to make a screening plan that is right for you. Questions to ask your health care provider  When should I start colorectal cancer screening?  What is my risk for colorectal cancer?  How often do I need screening?  Which screening tests do I need?  How do I get my test results?  What do my results mean? Where to find more information Learn more about colorectal cancer screening from:  The [place]: www.cancer.org  The [employer]: www.cancer.gov Summary  Colorectal cancer screening is a group of tests used to check for colorectal cancer before symptoms develop.  Screening reduces your risk for colorectal cancer and can help identify cancer at an early stage, when the cancer can be removed or treated more easily.  All adults starting at age 62 until age 62 should have screening. Your health care provider may recommend screening at age 62.  You may have screening tests starting at an earlier age, or more frequently than other people, if you have certain risk factors.  Talk with your health care provider to understand your risk for colorectal cancer and to make a screening plan that is right for you. This information is not intended to replace advice given to you by your health care provider. Make sure you discuss any questions you have with your health care  provider. Document Revised: 10/20/2018 Document Reviewed: 04/01/2017 Elsevier Patient Education  [place].

## 2019-08-22 NOTE — Progress Notes (Signed)
Established Patient Office Visit  Subjective:  Patient ID: Brenda Peters, female    DOB: 1956/02/15  Age: 64 y.o. MRN: [id number]  CC:  Chief Complaint  Patient presents with  . Weight Check    3 m f/u   . Hypertension  . am nose stuffy    one year     HPI Brenda Peters presents for    Obesity Patient reports that she has been working out She states that she does not see as much weight loss as she expected but she lost two pounds based on her home scale.  Body mass index is 30.86 kg/m.  Wt Readings from Last 3 Encounters:  08/22/19 158 lb (71.7 kg)  06/08/19 159 lb (72.1 kg)  05/31/19 158 lb 12.8 oz (72 kg)    Hypertension: Patient here for follow-up of elevated blood pressure. She is exercising and is adherent to low salt diet.  Blood pressure is not well controlled at home. Cardiac symptoms none. Patient denies chest pain, chest pressure/discomfort, claudication, exertional chest pressure/discomfort, irregular heart beat, lower extremity edema and near-syncope.  Cardiovascular risk factors: hypertension and obesity (BMI >= 30 kg/m2). Use of agents associated with hypertension: none. History of target organ damage: none.  BP Readings from Last 3 Encounters:  08/22/19 (!) 150/82  05/31/19 (!) 142/84  08/17/18 (!) 175/91   Hand stiffness She has a history of carpal tunnel She states that her left hand is still stiff She stats that grasping things is associated with stiffness  Colon Cancer Screening She denies blood in his stool, unexpected weight loss or pain with defecation No rectal itching She does not smoke She does not have a family history of colon cancer    Past Medical History:  Diagnosis Date  . Asthma   . Carpal tunnel syndrome   . Heart murmur   . Heart palpitations   . High cholesterol   . Hypertension     Past Surgical History:  Procedure Laterality Date  . TUBAL LIGATION      Family History  Problem Relation Age of Onset  .  Diabetes Mother   . Hypertension Mother     Social History   Socioeconomic History  . Marital status: Single    Spouse name: Not on file  . Number of children: Not on file  . Years of education: Not on file  . Highest education level: Not on file  Occupational History  . Not on file  Tobacco Use  . Smoking status: Never Smoker  . Smokeless tobacco: Never Used  Substance and Sexual Activity  . Alcohol use: No  . Drug use: No  . Sexual activity: Not on file  Other Topics Concern  . Not on file  Social History Narrative  . Not on file   Social Determinants of Health   Financial Resource Strain:   . Difficulty of Paying Living Expenses: Not on file  Food Insecurity:   . Worried About Charity fundraiser in the Last Year: Not on file  . Ran Out of Food in the Last Year: Not on file  Transportation Needs:   . Lack of Transportation (Medical): Not on file  . Lack of Transportation (Non-Medical): Not on file  Physical Activity:   . Days of Exercise per Week: Not on file  . Minutes of Exercise per Session: Not on file  Stress:   . Feeling of Stress : Not on file  Social Connections:   . Frequency  of Communication with Friends and Family: Not on file  . Frequency of Social Gatherings with Friends and Family: Not on file  . Attends Religious Services: Not on file  . Active Member of Clubs or Organizations: Not on file  . Attends Archivist Meetings: Not on file  . Marital Status: Not on file  Intimate Partner Violence:   . Fear of Current or Ex-Partner: Not on file  . Emotionally Abused: Not on file  . Physically Abused: Not on file  . Sexually Abused: Not on file    Outpatient Medications Prior to Visit  Medication Sig Dispense Refill  . cetirizine (ZYRTEC) 10 MG tablet Take 1 tablet (10 mg total) by mouth daily. 30 tablet 11  . cyanocobalamin ([facility] VITAMIN B12) 1000 MCG tablet Take 1 tablet by mouth daily.    . fluticasone (FLONASE) 50 MCG/ACT nasal spray  Place 2 sprays into both nostrils daily.    Marland Kitchen ibuprofen (ADVIL) 800 MG tablet Take 1 tablet (800 mg total) by mouth every 8 (eight) hours as needed. Take with food 30 tablet 1  . mometasone (NASONEX) 50 MCG/ACT nasal spray Place 1 spray into the nose daily. 17 g 2   No facility-administered medications prior to visit.    Allergies  Allergen Reactions  . Codeine Palpitations, Other (See Comments) and Shortness Of Breath    Cardiac arrest////shortness of breath  . Latex Itching and Rash  . Lisinopril Cough  . Lovastatin Other (See Comments)    Cramps    ROS Review of Systems Review of Systems  Constitutional: Negative for activity change, appetite change, chills and fever.  HENT: Negative for congestion, nosebleeds, trouble swallowing and voice change.   Respiratory: Negative for cough, shortness of breath and wheezing.   Gastrointestinal: Negative for diarrhea, nausea and vomiting.  Genitourinary: Negative for difficulty urinating, dysuria, flank pain and hematuria.  Musculoskeletal: see hpi Neurological: Negative for dizziness, speech difficulty, light-headedness and numbness.  See HPI. All other review of systems negative.     Objective:    Physical Exam  BP (!) 150/82   Pulse 78   Temp 98.3 F (36.8 C) (Temporal)   Ht 5' (1.524 m)   Wt 158 lb (71.7 kg)   LMP 12/29/2012   SpO2 97%   BMI 30.86 kg/m  Wt Readings from Last 3 Encounters:  08/22/19 158 lb (71.7 kg)  06/08/19 159 lb (72.1 kg)  05/31/19 158 lb 12.8 oz (72 kg)    Physical Exam  Constitutional: Oriented to person, place, and time. Appears well-developed and well-nourished.  HENT:  Head: Normocephalic and atraumatic.  Eyes: Conjunctivae and EOM are normal.  Cardiovascular: Normal rate, regular rhythm, normal heart sounds and intact distal pulses.  No murmur heard. Pulmonary/Chest: Effort normal and breath sounds normal. No stridor. No respiratory distress. Has no wheezes.  Neurological: Is alert and  oriented to person, place, and time.  Skin: Skin is warm. Capillary refill takes less than 2 seconds.  Psychiatric: Has a normal mood and affect. Behavior is normal. Judgment and thought content normal.    CLINICAL DATA:  And stiffness  EXAM: LEFT HAND - COMPLETE 3+ VIEW  COMPARISON:  None.  FINDINGS: No acute fracture or dislocation. No aggressive osseous lesion. Chondrocalcinosis of the scapholunate joint. No soft tissue abnormality.  IMPRESSION: 1. No acute osseous injury of the left hand. 2. 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   Electronically Signed   By: Kathreen Devoid  On: 08/22/2019 11:14  CLINICAL DATA:  64 year old female with left wrist pain.  EXAM: LEFT WRIST - COMPLETE 3+ VIEW  COMPARISON:  None.  FINDINGS: There is no acute fracture or dislocation. 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 No significant arthritic changes. The soft tissues are unremarkable.  IMPRESSION: 1. No acute fracture or dislocation. 2. Juxta-articular osteopenia.   Electronically Signed   By: Anner Crete M.D.   On: 05/31/2019 12:11   Health Maintenance Due  Topic Date Due  . COLONOSCOPY  04/05/2008    There are no preventive care reminders to display for this patient.  Lab Results  Component Value Date   TSH 4.760 (H) 05/31/2019   Lab Results  Component Value Date   WBC 6.9 05/31/2019   HGB 13.7 05/31/2019   HCT 40.7 05/31/2019   MCV 91 05/31/2019   PLT 223 05/31/2019   Lab Results  Component Value Date   NA 143 05/31/2019   K 4.1 05/31/2019   CO2 20 05/31/2019   GLUCOSE 108 (H) 05/31/2019   BUN 13 05/31/2019   CREATININE 0.92 05/31/2019   BILITOT 0.6 05/31/2019   ALKPHOS 63 05/31/2019   AST 15 05/31/2019   ALT 10 05/31/2019   PROT 6.8 05/31/2019   ALBUMIN 4.4 05/31/2019   CALCIUM 10.0 05/31/2019   ANIONGAP 4 (L) 05/05/2018   Lab Results  Component Value Date   CHOL 243 (H) 05/31/2019   Lab  Results  Component Value Date   HDL 75 05/31/2019   Lab Results  Component Value Date   LDLCALC 142 (H) 05/31/2019   Lab Results  Component Value Date   TRIG 146 05/31/2019   Lab Results  Component Value Date   CHOLHDL 3.2 05/31/2019   Lab Results  Component Value Date   HGBA1C 6.0 (A) 08/22/2019      Assessment & Plan:   Problem List Items Addressed This Visit      Cardiovascular and Mediastinum   HTN (hypertension)  -  Discussed that she should continue exercise, take meds the morning of her next appt     Other Visit Diagnoses    Prediabetes    -  Primary Stable, continue ADA diet   Relevant Orders   POCT glycosylated hemoglobin (Hb A1C) (Completed)   Stiffness of left hand joint    -  Discussed referral to Orthopedic Hand Surgery XRAY shows Pseudogout Sent in colchicine Pt should not take NSAIDs   Relevant Orders   DG Hand Complete Left   Special screening for malignant neoplasms, colon    - Patient declines colonoscopy, understanding that it's the better test for detection of colon cancer, but will do COLOGUARD. Use explained.     Relevant Orders   Cologuard      No orders of the defined types were placed in this encounter.   Follow-up: No follow-ups on file.    Forrest Moron, MD

## 2019-08-28 MED ORDER — COLCHICINE 0.6 MG PO TABS: 0.6000 mg | ORAL_TABLET | Freq: Every day | ORAL | 1 refills | Status: DC | PRN

## 2019-08-29 ENCOUNTER — Ambulatory Visit: Payer: BC Managed Care – PPO | Admitting: Family Medicine

## 2019-09-08 ENCOUNTER — Other Ambulatory Visit: Payer: Self-pay

## 2019-09-08 ENCOUNTER — Ambulatory Visit: Payer: BC Managed Care – PPO | Attending: Internal Medicine

## 2019-09-08 DIAGNOSIS — Z23 Encounter for immunization: Secondary | ICD-10-CM | POA: Insufficient documentation

## 2019-09-08 NOTE — Progress Notes (Signed)
   Covid-19 Vaccination Clinic  Name:  Brenda Peters    MRN: [id number] DOB: 1955-09-29  09/08/2019  [name] was observed post Covid-19 immunization for 15 minutes without incidence. She was provided with Vaccine Information Sheet and instruction to access the V-Safe system.   [name] was instructed to call 911 with any severe reactions post vaccine: Marland Kitchen Difficulty breathing  . Swelling of your face and throat  . A fast heartbeat  . A bad rash all over your body  . Dizziness and weakness    Immunizations Administered    Name Date Dose VIS Date Route   Moderna COVID-19 Vaccine 09/08/2019  3:05 PM 0.5 mL 06/14/2019 Intramuscular   Manufacturer: Moderna   [id number]   [id number]

## 2019-09-09 DIAGNOSIS — G5603 Carpal tunnel syndrome, bilateral upper limbs: Secondary | ICD-10-CM | POA: Insufficient documentation

## 2019-09-09 DIAGNOSIS — M47812 Spondylosis without myelopathy or radiculopathy, cervical region: Secondary | ICD-10-CM | POA: Insufficient documentation

## 2019-10-05 LAB — COLOGUARD: Cologuard: NEGATIVE

## 2019-10-11 ENCOUNTER — Ambulatory Visit: Payer: BC Managed Care – PPO | Attending: Family

## 2019-10-11 DIAGNOSIS — Z23 Encounter for immunization: Secondary | ICD-10-CM

## 2019-10-11 NOTE — Progress Notes (Signed)
   Covid-19 Vaccination Clinic  Name:  Brenda Peters    MRN: [id number] DOB: 04/01/1956  10/11/2019  Brenda Peters was observed post Covid-19 immunization for 15 minutes without incident. She was provided with Vaccine Information Sheet and instruction to access the V-Safe system.   Brenda Peters was instructed to call 911 with any severe reactions post vaccine: Marland Kitchen Difficulty breathing  . Swelling of face and throat  . A fast heartbeat  . A bad rash all over body  . Dizziness and weakness   Immunizations Administered    Name Date Dose VIS Date Route   Moderna COVID-19 Vaccine 10/11/2019  9:32 AM 0.5 mL 06/14/2019 Intramuscular   Manufacturer: Moderna   [id number]   [place]:[id number]

## 2019-12-29 ENCOUNTER — Encounter (HOSPITAL_COMMUNITY): Payer: Self-pay

## 2019-12-29 ENCOUNTER — Other Ambulatory Visit: Payer: Self-pay

## 2019-12-29 ENCOUNTER — Ambulatory Visit (HOSPITAL_COMMUNITY)
Admission: EM | Admit: 2019-12-29 | Discharge: 2019-12-29 | Disposition: A | Discharge status: Home / Self Care | Payer: BC Managed Care – PPO | Attending: Urgent Care | Admitting: Urgent Care

## 2019-12-29 DIAGNOSIS — Z20822 Contact with and (suspected) exposure to covid-19: Secondary | ICD-10-CM | POA: Insufficient documentation

## 2019-12-29 DIAGNOSIS — R519 Headache, unspecified: Secondary | ICD-10-CM

## 2019-12-29 DIAGNOSIS — R197 Diarrhea, unspecified: Secondary | ICD-10-CM | POA: Diagnosis not present

## 2019-12-29 DIAGNOSIS — R05 Cough: Secondary | ICD-10-CM | POA: Diagnosis not present

## 2019-12-29 DIAGNOSIS — J45909 Unspecified asthma, uncomplicated: Secondary | ICD-10-CM | POA: Diagnosis not present

## 2019-12-29 DIAGNOSIS — R630 Anorexia: Secondary | ICD-10-CM

## 2019-12-29 DIAGNOSIS — R030 Elevated blood-pressure reading, without diagnosis of hypertension: Secondary | ICD-10-CM

## 2019-12-29 DIAGNOSIS — B349 Viral infection, unspecified: Secondary | ICD-10-CM | POA: Diagnosis not present

## 2019-12-29 DIAGNOSIS — R0981 Nasal congestion: Secondary | ICD-10-CM

## 2019-12-29 DIAGNOSIS — Z79899 Other long term (current) drug therapy: Secondary | ICD-10-CM | POA: Diagnosis not present

## 2019-12-29 DIAGNOSIS — I10 Essential (primary) hypertension: Secondary | ICD-10-CM

## 2019-12-29 DIAGNOSIS — R1010 Upper abdominal pain, unspecified: Secondary | ICD-10-CM | POA: Diagnosis not present

## 2019-12-29 DIAGNOSIS — R059 Cough, unspecified: Secondary | ICD-10-CM

## 2019-12-29 DIAGNOSIS — E7800 Pure hypercholesterolemia, unspecified: Secondary | ICD-10-CM | POA: Insufficient documentation

## 2019-12-29 MED ORDER — ONDANSETRON 8 MG PO TBDP: 8.0000 mg | ORAL_TABLET | Freq: Three times a day (TID) | ORAL | 0 refills | Status: DC | PRN

## 2019-12-29 MED ORDER — CETIRIZINE HCL 10 MG PO TABS: 10.0000 mg | ORAL_TABLET | Freq: Every day | ORAL | 0 refills | Status: DC

## 2019-12-29 NOTE — Discharge Instructions (Addendum)
We will notify you of your COVID-19 test results as they arrive and may take between 24 to 48 hours.  I encourage you to sign up for MyChart if you have not already done so as this can be the easiest way for us to communicate results to you online or through a phone app.  In the meantime, if you develop worsening symptoms including fever, chest pain, shortness of breath despite our current treatment plan then please report to the emergency room as this may be a sign of worsening status from possible COVID-19 infection.  Otherwise, we will manage this as a viral syndrome. For sore throat or cough try using a honey-based tea. Use 3 teaspoons of honey with juice squeezed from half lemon. Place shaved pieces of ginger into 1/2-1 cup of water and warm over stove top. Then mix the ingredients and repeat every 4 hours as needed. Please take Tylenol 500mg-650mg every 6 hours for aches and pains, fevers. Hydrate very well with at least 2 liters of water. Eat light meals such as soups to replenish electrolytes and soft fruits, veggies. Start an antihistamine like Zyrtec, Allegra or Claritin for postnasal drainage, sinus congestion.    For diabetes or elevated blood sugar, please make sure you are avoiding starchy, carbohydrate foods like pasta, breads, pastry, rice, potatoes, desserts. These foods can elevated your blood sugar. Also, avoid sodas, sweet teas, sugary beverages, fruit juices.  Drinking plain water will be much more helpful, try 64 ounces of water daily.  It is okay to flavor your water naturally by cutting cucumber, lemon, mint or lime, placing it in a picture with water and drinking it over a period of 2 to 3 days as long as it remains refrigerated.    For elevated blood pressure, make sure you are monitoring salt in your diet.  Do not eat restaurant foods and limit processed foods at home, prepare/cook your own foods at home.  Processed foods include things like frozen meals preseasoned meats and  dinners, deli meats, canned foods as they are high in sodium/salt.  Make sure your pain attention to sodium labels on foods you by at the grocery store.  For seasoning you can use a brand called Mrs. Dash which includes a lot of salt free seasonings.  Salads - kale, spinach, cabbage, spring mix; use seeds like pumpkin seeds or sunflower seeds, almonds, walnuts or pecans; you can also use 1-2 hard boiled eggs in your salads Fruits - avocadoes, berries (blueberries, raspberries, blackberries), apples, oranges, pomegranate, pear; avoid eating bananas, grapes regularly Vegetables - aspargus, cauliflower, broccoli, green beans, brussel spouts, bell peppers; stay away from starchy vegetables like potatoes, carrots, peas  Regarding meat it is better to eat lean meats and limit your red meat including pork to once a week.  Wild caught fish, chicken breast are good options as they tend to be leaner sources of good protein.   DO NOT EAT ANY FOODS ON THIS LIST THAT YOU ARE ALLERGIC TO.  

## 2019-12-29 NOTE — ED Provider Notes (Signed)
[place]   MRN: [id number] DOB: Feb 27, 1956  Subjective:   Brenda Peters is a 64 y.o. female presenting for 3-day history of sinus congestion, mild cough, nausea, decreased appetite, intermittent diarrhea over the past 5 days, left elbow pain.  Patient has been using ibuprofen.  Has a history of hypertension, is compliant with her diet and medications.  Patient works strenuous job, has to do a lot of lifting and repetitive motions with her arms.  Denies fever, confusion, chest pain, shortness of breath, weakness, bloody stools, recent antibiotic use, recent hospitalizations.  No current facility-administered medications for this encounter.  Current Outpatient Medications:  .  cetirizine (ZYRTEC) 10 MG tablet, Take 1 tablet (10 mg total) by mouth daily., Disp: 30 tablet, Rfl: 11 .  colchicine 0.6 MG tablet, Take 1 tablet (0.6 mg total) by mouth daily as needed. For hand pain, Disp: 30 tablet, Rfl: 1 .  cyanocobalamin ([employer] VITAMIN B12) 1000 MCG tablet, Take 1 tablet by mouth daily., Disp: , Rfl:  .  fluticasone (FLONASE) 50 MCG/ACT nasal spray, Place 2 sprays into both nostrils daily., Disp: , Rfl:  .  ibuprofen (ADVIL) 800 MG tablet, Take 1 tablet (800 mg total) by mouth every 8 (eight) hours as needed. Take with food, Disp: 30 tablet, Rfl: 1 .  mometasone (NASONEX) 50 MCG/ACT nasal spray, Place 1 spray into the nose daily., Disp: 17 g, Rfl: 2   Allergies  Allergen Reactions  . Codeine Palpitations, Other (See Comments) and Shortness Of Breath    Cardiac arrest////shortness of breath  . Latex Itching and Rash  . Lisinopril Cough  . Lovastatin Other (See Comments)    Cramps    Past Medical History:  Diagnosis Date  . Asthma   . Carpal tunnel syndrome   . Heart murmur   . Heart palpitations   . High cholesterol   . Hypertension      Past Surgical History:  Procedure Laterality Date  . TUBAL LIGATION      Family History  Problem Relation Age of Onset  .  Diabetes Mother   . Hypertension Mother     Social History   Tobacco Use  . Smoking status: Never Smoker  . Smokeless tobacco: Never Used  Vaping Use  . Vaping Use: Never used  Substance Use Topics  . Alcohol use: No  . Drug use: No    ROS   Objective:   Vitals: BP (!) 189/100   Pulse 69   Temp 97.9 F (36.6 C) (Oral)   Resp 16   Ht 5' (1.524 m)   Wt 145 lb (65.8 kg)   LMP 12/29/2012   SpO2 100%   BMI 28.32 kg/m   BP recheck was 169/99.   BP Readings from Last 3 Encounters:  12/29/19 (!) 189/100  08/22/19 (!) 150/82  05/31/19 (!) 142/84   Physical Exam Constitutional:      General: She is not in acute distress.    Appearance: Normal appearance. She is well-developed and normal weight. She is not ill-appearing, toxic-appearing or diaphoretic.  HENT:     Head: Normocephalic and atraumatic.     Right Ear: Tympanic membrane, ear canal and external ear normal. No drainage or tenderness. No middle ear effusion. Tympanic membrane is not erythematous.     Left Ear: Tympanic membrane, ear canal and external ear normal. No drainage or tenderness.  No middle ear effusion. Tympanic membrane is not erythematous.     Nose: Nose normal. No congestion or  rhinorrhea.     Mouth/Throat:     Mouth: Mucous membranes are moist. No oral lesions.     Pharynx: Oropharynx is clear. No pharyngeal swelling, oropharyngeal exudate, posterior oropharyngeal erythema or uvula swelling.     Tonsils: No tonsillar exudate or tonsillar abscesses.  Eyes:     General: No scleral icterus.    Extraocular Movements: Extraocular movements intact.     Right eye: Normal extraocular motion.     Left eye: Normal extraocular motion.     Conjunctiva/sclera: Conjunctivae normal.     Pupils: Pupils are equal, round, and reactive to light.  Cardiovascular:     Rate and Rhythm: Normal rate and regular rhythm.     Pulses: Normal pulses.     Heart sounds: Normal heart sounds. No murmur heard.  No friction  rub. No gallop.   Pulmonary:     Effort: Pulmonary effort is normal. No respiratory distress.     Breath sounds: Normal breath sounds. No stridor. No wheezing, rhonchi or rales.  Abdominal:     General: Bowel sounds are normal. There is no distension.     Palpations: Abdomen is soft. There is no mass.     Tenderness: There is abdominal tenderness (upper). There is no right CVA tenderness, left CVA tenderness, guarding or rebound.  Musculoskeletal:     Cervical back: Normal range of motion and neck supple.  Lymphadenopathy:     Cervical: No cervical adenopathy.  Skin:    General: Skin is warm and dry.     Coloration: Skin is not pale.     Findings: No rash.  Neurological:     General: No focal deficit present.     Mental Status: She is alert and oriented to person, place, and time.     Cranial Nerves: No cranial nerve deficit.     Motor: No weakness.     Coordination: Coordination normal.     Gait: Gait normal.     Deep Tendon Reflexes: Reflexes normal.  Psychiatric:        Mood and Affect: Mood normal.        Behavior: Behavior normal.        Thought Content: Thought content normal.        Judgment: Judgment normal.     ED ECG REPORT   Date: 12/29/2019  Rate: 77 bpm  Rhythm: normal sinus rhythm  QRS Axis: normal  Intervals: normal  ST/T Wave abnormalities: normal  Conduction Disutrbances:none  Narrative Interpretation: Random PVC but otherwise sinus rhythm at 77 bpm, no acute changes compared to previous EKG.  Old EKG Reviewed: unchanged  I have personally reviewed the EKG tracing and agree with the computerized printout as noted.   Assessment and Plan :   PDMP not reviewed this encounter.  1. Viral syndrome   2. Sinus headache   3. Sinus congestion   4. Cough   5. Upper abdominal pain   6. Diarrhea, unspecified type   7. Decreased appetite   8. Essential hypertension   9. Elevated blood pressure reading     Recommended patient stop using ibuprofen as I  suspect this is causing her belly pain, elevation in blood pressure and loose stools.  Also possible that she has a viral syndrome eliciting the symptoms.  Recommended supportive care, COVID-19 testing pending.  Strict ER precautions.  Maintain chronic medications. Counseled patient on potential for adverse effects with medications prescribed/recommended today, ER and return-to-clinic precautions discussed, patient verbalized understanding.    Bess Harvest,  Freida Busman, PA-C 12/29/19 1115

## 2019-12-29 NOTE — ED Triage Notes (Signed)
Pt c/o 9/10 throbbing epigastric of abdomen, N/D, HA, nasal congestionx3 days. Pt c/o left elbow and upper arm are swollenx2 wks. Pt states had 5 bouts of diarrhea in past 3 days.

## 2019-12-30 LAB — SARS CORONAVIRUS 2 (TAT 6-24 HRS): SARS Coronavirus 2: NEGATIVE

## 2020-07-18 ENCOUNTER — Ambulatory Visit: Payer: BC Managed Care – PPO | Attending: Family

## 2020-07-18 DIAGNOSIS — Z23 Encounter for immunization: Secondary | ICD-10-CM

## 2020-11-01 ENCOUNTER — Encounter (HOSPITAL_COMMUNITY): Payer: Self-pay

## 2020-11-01 ENCOUNTER — Other Ambulatory Visit: Payer: Self-pay

## 2020-11-01 ENCOUNTER — Ambulatory Visit (HOSPITAL_COMMUNITY)
Admission: EM | Admit: 2020-11-01 | Discharge: 2020-11-01 | Disposition: A | Discharge status: Home / Self Care | Payer: BC Managed Care – PPO

## 2020-11-01 DIAGNOSIS — M7712 Lateral epicondylitis, left elbow: Secondary | ICD-10-CM

## 2020-11-01 DIAGNOSIS — G5602 Carpal tunnel syndrome, left upper limb: Secondary | ICD-10-CM

## 2020-11-01 NOTE — ED Provider Notes (Signed)
[facility]    CSN: [id number] Arrival date & time: 11/01/20  1057      History   Chief Complaint Chief Complaint  Patient presents with  . Arm Pain    HPI Brenda Peters is a 65 y.o. female presenting with multiple MSK issues. Medical history R carpal tunnel syndrome which has been treated conservatively.  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 She is right handed.  -Notes 1 month of left hand numbness and tingling over her fingers 1 through 3.  Some weakness in the hand, especially in the mornings. -1 week of left lateral elbow pain.  Denies shoulder pain, neck pain, numbness and tingling in upper arm or forearm.  HPI  Past Medical History:  Diagnosis Date  . Asthma   . Carpal tunnel syndrome   . Heart murmur   . Heart palpitations   . High cholesterol   . Hypertension     Patient Active Problem List   Diagnosis Date Noted  . Ankle pain, chronic 09/21/2013  . Dry eye syndrome 06/02/2013  . Family history of premature CAD 07/26/2012  . Carpal tunnel syndrome on right 01/21/2011  . HTN (hypertension) 01/21/2011    Past Surgical History:  Procedure Laterality Date  . TUBAL LIGATION      OB History   No obstetric history on file.      Home Medications    Prior to Admission medications   Medication Sig Start Date End Date Taking? Authorizing Provider  amLODipine (NORVASC) 2.5 MG tablet Take by mouth. 05/24/20  Yes [provider]  cetirizine (ZYRTEC) 10 MG tablet Take 1 tablet (10 mg total) by mouth daily. 12/29/19   Jaynee Eagles, PA-C  colchicine 0.6 MG tablet Take 1 tablet (0.6 mg total) by mouth daily as needed. For hand pain 08/28/19   Forrest Moron, MD  cyanocobalamin 1000 MCG tablet Take 1 tablet by mouth daily.    [provider]  fluticasone (FLONASE) 50 MCG/ACT nasal spray Place 2 sprays into both nostrils daily. 06/08/19   [provider]  ibuprofen (ADVIL) 800 MG tablet Take 1 tablet  (800 mg total) by mouth every 8 (eight) hours as needed. Take with food 06/08/19   Delia Chimes A, MD  mometasone (NASONEX) 50 MCG/ACT nasal spray Place 1 spray into the nose daily. 06/08/19   Forrest Moron, MD  ondansetron (ZOFRAN-ODT) 8 MG disintegrating tablet Take 1 tablet (8 mg total) by mouth every 8 (eight) hours as needed for nausea or vomiting. 12/29/19   Jaynee Eagles, PA-C    Family History Family History  Problem Relation Age of Onset  . Diabetes Mother   . Hypertension Mother     Social History Social History   Tobacco Use  . Smoking status: Never Smoker  . Smokeless tobacco: Never Used  Vaping Use  . Vaping Use: Never used  Substance Use Topics  . Alcohol use: No  . Drug use: No     Allergies   Codeine, Latex, Lisinopril, and Lovastatin   Review of Systems Review of Systems  Musculoskeletal:       Left elbow pain Left hand numbness and tingling  All other systems reviewed and are negative.    Physical Exam Triage Vital Signs ED Triage Vitals  Enc Vitals Group     BP 11/01/20 1107 (S) (!) 161/95     Pulse Rate 11/01/20 1107 72     Resp 11/01/20 1107 16  Temp 11/01/20 1107 98.3 F (36.8 C)     Temp Source 11/01/20 1107 Oral     SpO2 11/01/20 1107 97 %     Weight --      Height --      Head Circumference --      Peak Flow --      Pain Score 11/01/20 1112 8     Pain Loc --      Pain Edu? --      Excl. in [place]? --    No data found.  Updated Vital Signs BP (S) (!) 161/95 (BP Location: Right Arm)   Pulse 72   Temp 98.3 F (36.8 C) (Oral)   Resp 16   LMP 12/29/2012   SpO2 97%   Visual Acuity Right Eye Distance:   Left Eye Distance:   Bilateral Distance:    Right Eye Near:   Left Eye Near:    Bilateral Near:     Physical Exam Vitals reviewed.  Constitutional:      General: She is not in acute distress.    Appearance: Normal appearance. She is not ill-appearing or diaphoretic.  HENT:     Head: Normocephalic and atraumatic.   Cardiovascular:     Rate and Rhythm: Normal rate and regular rhythm.     Heart sounds: Normal heart sounds.  Pulmonary:     Effort: Pulmonary effort is normal.     Breath sounds: Normal breath sounds.  Musculoskeletal:     Comments: L elbow- Tenderness along lateral epicondyle. No effusion ecchymosis or bony deformity. ROM intact but some pain with flexion against resistance. No crepitus, strength 5/5.   L wrist with decreased sensation of fingers 1-3, volar aspect. Grip strength 4/5. Cap refill <2 seconds, radial pulse 2+, neurovascularly intact. Positive phalen sign.   Skin:    General: Skin is warm.  Neurological:     General: No focal deficit present.     Mental Status: She is alert and oriented to person, place, and time.  Psychiatric:        Mood and Affect: Mood normal.        Behavior: Behavior normal.        Thought Content: Thought content normal.        Judgment: Judgment normal.      UC Treatments / Results  Labs (all labs ordered are listed, but only abnormal results are displayed) Labs Reviewed - No data to display  EKG   Radiology No results found.  Procedures Procedures (including critical care time)  Medications Ordered in UC Medications - No data to display  Initial Impression / Assessment and Plan / UC Course  I have reviewed the triage vital signs and the nursing notes.  Pertinent labs & imaging results that were available during my care of the patient were reviewed by me and considered in my medical decision making (see chart for details).     This patient is a 65 year old female presenting with lateral epicondylitis and left carpal tunnel syndrome.  Wrist brace and elbow sleeve as below.  Tylenol and ibuprofen for pain.  Follow-up with Ortho if symptoms persist, information provided and she already has an orthopedist.  Final Clinical Impressions(s) / UC Diagnoses   Final diagnoses:  Lateral epicondylitis of left elbow  Carpal tunnel  syndrome on left     Discharge Instructions     -Keep your wrist brace on for about 2 weeks. Use this at night, and during the day if you're  also having symptoms during your daily activities. -Use your elbow sleeve while you're having pain for the next few weeks. Don't do anything that causes pain.  -Follow-up with orthopedist if symptoms persist; information below.   ED Prescriptions    None     PDMP not reviewed this encounter.   Hazel Sams, PA-C 11/01/20 1148

## 2020-11-01 NOTE — ED Triage Notes (Signed)
Patient presents to Urgent Care with complaints of left arm pain that starts from the elbow radiates to shoulder area. She states it may be related to her type of work that involves a lot of repetitive arm movement increasing her joint pain. She reports the left elbow she has been hearing a "clicking sound."  She has also noted some tingling in left  fingers. Treating pain with otc tylenol. Applying ice and heat to shoulder and elbow with no relief. She states she has a hx of carpel tunnel syndrome.   Denies numbness and tingling in arm.

## 2020-11-01 NOTE — Discharge Instructions (Addendum)
-  Keep your wrist brace on for about 2 weeks. Use this at night, and during the day if you're also having symptoms during your daily activities. -Use your elbow sleeve while you're having pain for the next few weeks. Don't do anything that causes pain.  -Follow-up with orthopedist if symptoms persist; information below.

## 2020-11-03 ENCOUNTER — Emergency Department (HOSPITAL_COMMUNITY)
Admission: EM | Admit: 2020-11-03 | Discharge: 2020-11-03 | Disposition: A | Discharge status: Home / Self Care | Payer: BC Managed Care – PPO | Attending: Emergency Medicine | Admitting: Emergency Medicine

## 2020-11-03 ENCOUNTER — Encounter (HOSPITAL_COMMUNITY): Payer: Self-pay

## 2020-11-03 DIAGNOSIS — J45909 Unspecified asthma, uncomplicated: Secondary | ICD-10-CM | POA: Insufficient documentation

## 2020-11-03 DIAGNOSIS — I10 Essential (primary) hypertension: Secondary | ICD-10-CM | POA: Diagnosis not present

## 2020-11-03 DIAGNOSIS — A084 Viral intestinal infection, unspecified: Secondary | ICD-10-CM | POA: Diagnosis not present

## 2020-11-03 DIAGNOSIS — Z91040 Latex allergy status: Secondary | ICD-10-CM | POA: Diagnosis not present

## 2020-11-03 DIAGNOSIS — Z79899 Other long term (current) drug therapy: Secondary | ICD-10-CM | POA: Diagnosis not present

## 2020-11-03 DIAGNOSIS — Z20822 Contact with and (suspected) exposure to covid-19: Secondary | ICD-10-CM | POA: Diagnosis not present

## 2020-11-03 DIAGNOSIS — R112 Nausea with vomiting, unspecified: Secondary | ICD-10-CM | POA: Diagnosis present

## 2020-11-03 LAB — URINALYSIS, ROUTINE W REFLEX MICROSCOPIC
Bilirubin Urine: NEGATIVE
Glucose, UA: NEGATIVE mg/dL
Nitrite: NEGATIVE
Specific Gravity, Urine: >1.03 — ABNORMAL HIGH (ref 1.005–1.030)
pH: 6 (ref 5.0–8.0)

## 2020-11-03 LAB — COMPREHENSIVE METABOLIC PANEL
ALT: 14 U/L (ref 0–44)
AST: 20 U/L (ref 15–41)
Albumin: 4.8 g/dL (ref 3.5–5.0)
Alkaline Phosphatase: 55 U/L (ref 38–126)
Anion gap: 9 (ref 5–15)
BUN: 16 mg/dL (ref 8–23)
CO2: 22 mmol/L (ref 22–32)
Calcium: 9.3 mg/dL (ref 8.9–10.3)
Chloride: 109 mmol/L (ref 98–111)
Creatinine, Ser: 1.03 mg/dL — ABNORMAL HIGH (ref 0.44–1.00)
GFR, Estimated: >60 mL/min (ref >60)
Glucose, Bld: 117 mg/dL — ABNORMAL HIGH (ref 70–99)
Potassium: 3.9 mmol/L (ref 3.5–5.1)
Sodium: 140 mmol/L (ref 135–145)
Total Bilirubin: 0.8 mg/dL (ref 0.3–1.2)
Total Protein: 8.3 g/dL — ABNORMAL HIGH (ref 6.5–8.1)

## 2020-11-03 LAB — CBC WITH DIFFERENTIAL/PLATELET
Abs Immature Granulocytes: 0.02 10*3/uL (ref 0.00–0.07)
Basophils Absolute: 0 10*3/uL (ref 0.0–0.1)
Basophils Relative: 0 %
Eosinophils Absolute: 0.1 10*3/uL (ref 0.0–0.5)
Eosinophils Relative: 2 %
HCT: 44.3 % (ref 36.0–46.0)
Hemoglobin: 14.7 g/dL (ref 12.0–15.0)
Immature Granulocytes: 0 %
Lymphocytes Relative: 9 %
Lymphs Abs: 0.5 10*3/uL — ABNORMAL LOW (ref 0.7–4.0)
MCH: 30.9 pg (ref 26.0–34.0)
MCHC: 33.2 g/dL (ref 30.0–36.0)
MCV: 93.3 fL (ref 80.0–100.0)
Monocytes Absolute: 0.2 10*3/uL (ref 0.1–1.0)
Monocytes Relative: 3 %
Neutro Abs: 5.3 10*3/uL (ref 1.7–7.7)
Neutrophils Relative %: 86 %
Platelets: 208 10*3/uL (ref 150–400)
RBC: 4.75 MIL/uL (ref 3.87–5.11)
RDW: 12.9 % (ref 11.5–15.5)
WBC: 6.1 10*3/uL (ref 4.0–10.5)
nRBC: 0 % (ref 0.0–0.2)

## 2020-11-03 LAB — RESP PANEL BY RT-PCR (FLU A&B, COVID) ARPGX2
Influenza A by PCR: NEGATIVE
Influenza B by PCR: NEGATIVE
SARS Coronavirus 2 by RT PCR: NEGATIVE

## 2020-11-03 LAB — URINALYSIS, MICROSCOPIC (REFLEX)

## 2020-11-03 LAB — LIPASE, BLOOD: Lipase: 28 U/L (ref 11–51)

## 2020-11-03 MED ORDER — DICYCLOMINE HCL 20 MG PO TABS: 20.0000 mg | ORAL_TABLET | Freq: Two times a day (BID) | ORAL | 0 refills | Status: DC

## 2020-11-03 MED ORDER — ONDANSETRON 4 MG PO TBDP: 4.0000 mg | ORAL_TABLET | Freq: Three times a day (TID) | ORAL | 0 refills | Status: DC | PRN

## 2020-11-03 MED ORDER — SODIUM CHLORIDE 0.9 % IV BOLUS
1000.0000 mL | Freq: Once | INTRAVENOUS | Status: AC
  Administered 2020-11-03: 1000 mL via INTRAVENOUS

## 2020-11-03 MED ORDER — ONDANSETRON HCL 4 MG/2ML IJ SOLN
4.0000 mg | Freq: Once | INTRAMUSCULAR | Status: AC
  Administered 2020-11-03: 4 mg via INTRAVENOUS
  Filled 2020-11-03: qty 2

## 2020-11-03 MED ORDER — ACETAMINOPHEN 325 MG PO TABS
650.0000 mg | ORAL_TABLET | Freq: Once | ORAL | Status: AC
  Administered 2020-11-03: 650 mg via ORAL
  Filled 2020-11-03: qty 2

## 2020-11-03 MED ORDER — DICYCLOMINE HCL 10 MG PO CAPS
10.0000 mg | ORAL_CAPSULE | Freq: Once | ORAL | Status: AC
  Administered 2020-11-03: 10 mg via ORAL
  Filled 2020-11-03: qty 1

## 2020-11-03 NOTE — ED Notes (Signed)
Patient stated she would like to try and give a urine sample. She was only able to provide a very small amount. Sample sent to lab.

## 2020-11-03 NOTE — ED Triage Notes (Signed)
Pt arrived via POV, c/o abd pain, n/v and diarrhea since 5pm yesterday. Denies any urinary sx or fevers.

## 2020-11-03 NOTE — ED Provider Notes (Signed)
[place] DEPT Provider Note   CSN: [id number] Arrival date & time: 11/03/20  0946     History Chief Complaint  Patient presents with  . Abdominal Pain  . Diarrhea    Brenda Peters is a 65 y.o. female.  HPI 65 year old female with with a history of asthma, carpal tunnel syndrome, hypertension, high cholesterol presents to the ER with complaints of nausea, nonbloody emesis, watery diarrhea since 5 PM yesterday.  Suspect viral gastroenteritis.  Denies any urinary symptoms or fevers.  She is vaccinated and boosted for COVID-19.  She does not know what could be causing her symptoms.  She denies any dysuria or hematuria.  Last bowel movement was earlier this morning and watery.  She has a history of a tubal ligation but no other abdominal surgeries.  She took some Tylenol for her symptoms but no other medicines    Past Medical History:  Diagnosis Date  . Asthma   . Carpal tunnel syndrome   . Heart murmur   . Heart palpitations   . High cholesterol   . Hypertension     Patient Active Problem List   Diagnosis Date Noted  . Ankle pain, chronic 09/21/2013  . Dry eye syndrome 06/02/2013  . Family history of premature CAD 07/26/2012  . Carpal tunnel syndrome on right 01/21/2011  . HTN (hypertension) 01/21/2011    Past Surgical History:  Procedure Laterality Date  . TUBAL LIGATION       OB History   No obstetric history on file.     Family History  Problem Relation Age of Onset  . Diabetes Mother   . Hypertension Mother     Social History   Tobacco Use  . Smoking status: Never Smoker  . Smokeless tobacco: Never Used  Vaping Use  . Vaping Use: Never used  Substance Use Topics  . Alcohol use: No  . Drug use: No    Home Medications Prior to Admission medications   Medication Sig Start Date End Date Taking? Authorizing Provider  dicyclomine (BENTYL) 20 MG tablet Take 1 tablet (20 mg total) by mouth 2 (two) times daily. 11/03/20   Yes Garald Balding, PA-C  ondansetron (ZOFRAN ODT) 4 MG disintegrating tablet Take 1 tablet (4 mg total) by mouth every 8 (eight) hours as needed for nausea or vomiting. 11/03/20  Yes Garald Balding, PA-C  amLODipine (NORVASC) 2.5 MG tablet Take by mouth. 05/24/20   [provider]  cetirizine (ZYRTEC) 10 MG tablet Take 1 tablet (10 mg total) by mouth daily. 12/29/19   Jaynee Eagles, PA-C  colchicine 0.6 MG tablet Take 1 tablet (0.6 mg total) by mouth daily as needed. For hand pain 08/28/19   Forrest Moron, MD  cyanocobalamin 1000 MCG tablet Take 1 tablet by mouth daily.    [provider]  fluticasone (FLONASE) 50 MCG/ACT nasal spray Place 2 sprays into both nostrils daily. 06/08/19   [provider]  ibuprofen (ADVIL) 800 MG tablet Take 1 tablet (800 mg total) by mouth every 8 (eight) hours as needed. Take with food 06/08/19   Delia Chimes A, MD  mometasone (NASONEX) 50 MCG/ACT nasal spray Place 1 spray into the nose daily. 06/08/19   Forrest Moron, MD    Allergies    Codeine, Latex, Lisinopril, and Lovastatin  Review of Systems   Review of Systems  Constitutional: Negative for chills and fever.  HENT: Negative for ear pain and sore throat.   Eyes: Negative  for pain and visual disturbance.  Respiratory: Negative for cough and shortness of breath.   Cardiovascular: Negative for chest pain and palpitations.  Gastrointestinal: Positive for abdominal pain, diarrhea, nausea and vomiting.  Genitourinary: Negative for dysuria and hematuria.  Musculoskeletal: Negative for arthralgias and back pain.  Skin: Negative for color change and rash.  Neurological: Negative for seizures and syncope.  All other systems reviewed and are negative.   Physical Exam Updated Vital Signs BP 137/67 (BP Location: Left Arm)   Pulse (!) 57   Temp 98.5 F (36.9 C) (Oral)   Resp 15   LMP 12/29/2012   SpO2 98%   Physical Exam Vitals and nursing note reviewed.   Constitutional:      General: She is not in acute distress.    Appearance: She is well-developed.  HENT:     Head: Normocephalic and atraumatic.  Eyes:     Conjunctiva/sclera: Conjunctivae normal.  Cardiovascular:     Rate and Rhythm: Normal rate and regular rhythm.     Heart sounds: No murmur heard.   Pulmonary:     Effort: Pulmonary effort is normal. No respiratory distress.     Breath sounds: Normal breath sounds.  Abdominal:     Palpations: Abdomen is soft.     Tenderness: There is abdominal tenderness.     Hernia: No hernia is present.     Comments: Very mild generalized abdominal tenderness, no focal tenderness, no CVA tenderness  Musculoskeletal:     Cervical back: Neck supple.  Skin:    General: Skin is warm and dry.  Neurological:     Mental Status: She is alert.     ED Results / Procedures / Treatments   Labs (all labs ordered are listed, but only abnormal results are displayed) Labs Reviewed  CBC WITH DIFFERENTIAL/PLATELET - Abnormal; Notable for the following components:      Result Value   Lymphs Abs 0.5 (*)    All other components within normal limits  COMPREHENSIVE METABOLIC PANEL - Abnormal; Notable for the following components:   Glucose, Bld 117 (*)    Creatinine, Ser 1.03 (*)    Total Protein 8.3 (*)    All other components within normal limits  URINALYSIS, ROUTINE W REFLEX MICROSCOPIC - Abnormal; Notable for the following components:   Specific Gravity, Urine >1.030 (*)    Hgb urine dipstick TRACE (*)    Ketones, ur TRACE (*)    Protein, ur TRACE (*)    Leukocytes,Ua TRACE (*)    All other components within normal limits  URINALYSIS, MICROSCOPIC (REFLEX) - Abnormal; Notable for the following components:   Bacteria, UA RARE (*)    All other components within normal limits  RESP PANEL BY RT-PCR (FLU A&B, COVID) ARPGX2  LIPASE, BLOOD    EKG EKG Interpretation  Date/Time:  Saturday November 03 2020 10:45:02 EDT Ventricular Rate:  77 PR  Interval:  159 QRS Duration: 91 QT Interval:  387 QTC Calculation: 438 R Axis:   -7 Text Interpretation: Sinus rhythm Baseline wander since last tracing no significant change Confirmed by Daleen Bo [phone]) on 11/03/2020 11:15:39 AM   Radiology No results found.  Procedures Procedures   Medications Ordered in ED Medications  sodium chloride 0.9 % bolus 1,000 mL (0 mLs Intravenous Stopped 11/03/20 1231)  ondansetron (ZOFRAN) injection 4 mg (4 mg Intravenous Given 11/03/20 1052)  acetaminophen (TYLENOL) tablet 650 mg (650 mg Oral Given 11/03/20 1156)  dicyclomine (BENTYL) capsule 10 mg (10 mg Oral Given  11/03/20 1156)    ED Course  I have reviewed the triage vital signs and the nursing notes.  Pertinent labs & imaging results that were available during my care of the patient were reviewed by me and considered in my medical decision making (see chart for details).  Clinical Course as of 11/03/20 1311  Sat Nov 03, 7236  2756 65 year old female with nausea vomiting, diarrhea and generalized abdominal pain since 5 PM yesterday.  On arrival, she is well-appearing, no acute distress, resting comfortably in the ER bed.  Vitals on arrival with mild tachycardia, elevated blood pressures, however suspect this is secondary due to pain and possibly dehydration.  Other vitals reassuring.  Physical exam with very mild generalized abdominal tenderness, no focal tenderness, no CVA tenderness.  DDx includes viral gastroenteritis, small bowel obstruction, diverticulitis, C. difficile colitis  Plan for basic labs, COVID test, fluids, Zofran, Tylenol and Bentyl.  Lower suspicion for acute surgical abdomen given physical exam, however will reevaluate after patient received treatment regimen. [MB]  1302 Labs overall reassuring.  CBC without leukocytosis, CMP with a mildly of a creatinine likely consistent with dehydration, no LFT elevation, normal total bili.  Lipase normal.  UA with trace ketones,  proteinuria, trace leukocytes and rare bacteria, however not overly consistent with UTI.  She does not have any urinary symptoms.  Low suspicion that this is contributing to her symptoms.  Patient received Zofran, Bentyl, Tylenol and a liter of fluids.  On reevaluation, notes improvement in her symptoms.  She does endorse still some mild abdominal discomfort, however on my reexamination of her abdomen she has no tenderness.  Suspect viral gastroenteritis.  COVID test is negative.  Tolerated p.o. fluid challenge well.  Will send home with Bentyl, Zofran, encouraged gentle fluid rehydration and bland diet.  We discussed return precautions.  She was understanding and is agreeable.  Stable for discharge. [MB]    Clinical Course User Index [MB] Lyndel Safe   MDM Rules/Calculators/A&P                          Final Clinical Impression(s) / ED Diagnoses Final diagnoses:  Viral gastroenteritis    Rx / DC Orders ED Discharge Orders         Ordered    dicyclomine (BENTYL) 20 MG tablet  2 times daily        11/03/20 1306    ondansetron (ZOFRAN ODT) 4 MG disintegrating tablet  Every 8 hours PRN        11/03/20 1306           Lyndel Safe 11/03/20 1312    Daleen Bo, MD 11/03/20 1659

## 2020-11-03 NOTE — ED Notes (Signed)
Patient stated she is still able able to give a urine sample at this time. She said she has not had anything to drink since yesterday.

## 2020-11-03 NOTE — Discharge Instructions (Addendum)
Please continue drink plenty fluids, use the nausea medicine and Bentyl for stomach spasms.  Please introduce food slowly, see the bland diet handout for examples of this.  Please return to the ER for any new or worsening symptoms.

## 2020-11-06 ENCOUNTER — Encounter (HOSPITAL_COMMUNITY): Payer: Self-pay

## 2020-11-06 ENCOUNTER — Emergency Department (HOSPITAL_COMMUNITY): Payer: BC Managed Care – PPO

## 2020-11-06 ENCOUNTER — Other Ambulatory Visit: Payer: Self-pay

## 2020-11-06 ENCOUNTER — Emergency Department (HOSPITAL_COMMUNITY)
Admission: EM | Admit: 2020-11-06 | Discharge: 2020-11-06 | Disposition: A | Discharge status: Home / Self Care | Payer: BC Managed Care – PPO | Attending: Emergency Medicine | Admitting: Emergency Medicine

## 2020-11-06 DIAGNOSIS — R519 Headache, unspecified: Secondary | ICD-10-CM | POA: Diagnosis not present

## 2020-11-06 DIAGNOSIS — R1084 Generalized abdominal pain: Secondary | ICD-10-CM

## 2020-11-06 DIAGNOSIS — J45909 Unspecified asthma, uncomplicated: Secondary | ICD-10-CM | POA: Diagnosis not present

## 2020-11-06 DIAGNOSIS — R197 Diarrhea, unspecified: Secondary | ICD-10-CM | POA: Diagnosis not present

## 2020-11-06 DIAGNOSIS — D72819 Decreased white blood cell count, unspecified: Secondary | ICD-10-CM | POA: Diagnosis not present

## 2020-11-06 DIAGNOSIS — R110 Nausea: Secondary | ICD-10-CM | POA: Insufficient documentation

## 2020-11-06 DIAGNOSIS — I10 Essential (primary) hypertension: Secondary | ICD-10-CM | POA: Diagnosis not present

## 2020-11-06 DIAGNOSIS — D259 Leiomyoma of uterus, unspecified: Secondary | ICD-10-CM | POA: Diagnosis not present

## 2020-11-06 DIAGNOSIS — Z91040 Latex allergy status: Secondary | ICD-10-CM | POA: Diagnosis not present

## 2020-11-06 DIAGNOSIS — Z9851 Tubal ligation status: Secondary | ICD-10-CM | POA: Diagnosis not present

## 2020-11-06 DIAGNOSIS — Z79899 Other long term (current) drug therapy: Secondary | ICD-10-CM | POA: Insufficient documentation

## 2020-11-06 DIAGNOSIS — Z7951 Long term (current) use of inhaled steroids: Secondary | ICD-10-CM | POA: Insufficient documentation

## 2020-11-06 DIAGNOSIS — N7011 Chronic salpingitis: Secondary | ICD-10-CM

## 2020-11-06 DIAGNOSIS — N7091 Salpingitis, unspecified: Secondary | ICD-10-CM | POA: Diagnosis not present

## 2020-11-06 LAB — COMPREHENSIVE METABOLIC PANEL
ALT: 13 U/L (ref 0–44)
AST: 15 U/L (ref 15–41)
Albumin: 3.9 g/dL (ref 3.5–5.0)
Alkaline Phosphatase: 46 U/L (ref 38–126)
Anion gap: 5 (ref 5–15)
BUN: 14 mg/dL (ref 8–23)
CO2: 23 mmol/L (ref 22–32)
Calcium: 8.8 mg/dL — ABNORMAL LOW (ref 8.9–10.3)
Chloride: 110 mmol/L (ref 98–111)
Creatinine, Ser: 0.86 mg/dL (ref 0.44–1.00)
GFR, Estimated: >60 mL/min (ref >60)
Glucose, Bld: 104 mg/dL — ABNORMAL HIGH (ref 70–99)
Potassium: 3.7 mmol/L (ref 3.5–5.1)
Sodium: 138 mmol/L (ref 135–145)
Total Bilirubin: 0.7 mg/dL (ref 0.3–1.2)
Total Protein: 6.7 g/dL (ref 6.5–8.1)

## 2020-11-06 LAB — LIPASE, BLOOD: Lipase: 30 U/L (ref 11–51)

## 2020-11-06 LAB — CBC WITH DIFFERENTIAL/PLATELET
Abs Immature Granulocytes: 0.01 10*3/uL (ref 0.00–0.07)
Basophils Absolute: 0 10*3/uL (ref 0.0–0.1)
Basophils Relative: 1 %
Eosinophils Absolute: 0.1 10*3/uL (ref 0.0–0.5)
Eosinophils Relative: 4 %
HCT: 40.2 % (ref 36.0–46.0)
Hemoglobin: 13.4 g/dL (ref 12.0–15.0)
Immature Granulocytes: 0 %
Lymphocytes Relative: 30 %
Lymphs Abs: 1.1 10*3/uL (ref 0.7–4.0)
MCH: 30.7 pg (ref 26.0–34.0)
MCHC: 33.3 g/dL (ref 30.0–36.0)
MCV: 92 fL (ref 80.0–100.0)
Monocytes Absolute: 0.2 10*3/uL (ref 0.1–1.0)
Monocytes Relative: 5 %
Neutro Abs: 2.2 10*3/uL (ref 1.7–7.7)
Neutrophils Relative %: 60 %
Platelets: 205 10*3/uL (ref 150–400)
RBC: 4.37 MIL/uL (ref 3.87–5.11)
RDW: 12.4 % (ref 11.5–15.5)
WBC: 3.7 10*3/uL — ABNORMAL LOW (ref 4.0–10.5)
nRBC: 0 % (ref 0.0–0.2)

## 2020-11-06 LAB — URINALYSIS, ROUTINE W REFLEX MICROSCOPIC
Bilirubin Urine: NEGATIVE
Glucose, UA: NEGATIVE mg/dL
Ketones, ur: NEGATIVE mg/dL
Leukocytes,Ua: NEGATIVE
Nitrite: NEGATIVE
Protein, ur: NEGATIVE mg/dL
Specific Gravity, Urine: 1.017 (ref 1.005–1.030)
pH: 5 (ref 5.0–8.0)

## 2020-11-06 MED ORDER — IOHEXOL 300 MG/ML  SOLN
100.0000 mL | Freq: Once | INTRAMUSCULAR | Status: AC | PRN
  Administered 2020-11-06: 100 mL via INTRAVENOUS

## 2020-11-06 MED ORDER — ONDANSETRON HCL 4 MG/2ML IJ SOLN
4.0000 mg | Freq: Once | INTRAMUSCULAR | Status: AC
Start: 2020-11-06 — End: 2020-11-06
  Administered 2020-11-06: 4 mg via INTRAVENOUS
  Filled 2020-11-06: qty 2

## 2020-11-06 MED ORDER — SODIUM CHLORIDE 0.9 % IV BOLUS
1000.0000 mL | Freq: Once | INTRAVENOUS | Status: AC
  Administered 2020-11-06: 1000 mL via INTRAVENOUS

## 2020-11-06 MED ORDER — MORPHINE SULFATE (PF) 4 MG/ML IV SOLN
4.0000 mg | Freq: Once | INTRAVENOUS | Status: DC
  Filled 2020-11-06: qty 1

## 2020-11-06 NOTE — ED Notes (Signed)
Called [place] to make aware patient is feeling urge to urinate and they can come perform [place]

## 2020-11-06 NOTE — ED Notes (Signed)
Patient refused morphine saying she don't like the way it makes her feel PA aware

## 2020-11-06 NOTE — ED Triage Notes (Signed)
Pt states seen in ED on 11/03/2020 for viral gastritis and she has had no improvement. Complaining of headache, diarrhea, nausea, body aches, abd pain

## 2020-11-06 NOTE — Discharge Instructions (Signed)
Your lab work and imaging today has overall been reassuring.  I do not see signs of inflammation in the colon or intestines and I am reassured that your diarrhea seems to be slowing down today.  Do not think you need antibiotics for this.  The ultrasound showed multiple uterine fibroids as well as some fluid around both of your fallopian tubes, low suspicion for infection in this area, but please follow-up with your OB/GYN regarding this.  Continue to use medications at home for symptomatic treatment, eat bland foods and drink plenty of fluids.  Follow-up with PCP if diarrhea is not resolving.  Return for new or worsening symptoms.

## 2020-11-06 NOTE — ED Provider Notes (Signed)
[facility] DEPT Provider Note   CSN: [id number] Arrival date & time: 11/06/20  0746     History Chief Complaint  Patient presents with  . Abdominal Pain  . Headache  . Diarrhea    Brenda Peters is a 65 y.o. female.  Brenda Peters is a 65 y.o. female with a history of hypertension, hyperlipidemia, palpitations, asthma, uterine fibroids, who presents to the emergency department for evaluation of continued diarrhea, generalized abdominal pain, nausea, body aches and headache.  She was seen in the ED on 4/23 for the symptoms, at that time they have been going on for about 2 days, her evaluation was reassuring and she was diagnosed with viral gastroenteritis.  She had reassuring lab work and negative COVID test and was discharged home with symptomatic treatment with Zofran and Bentyl.  She reports she has been using these but has continued to have some pain, and this seemed to get a bit worse today.  She reports she still having multiple bowel movements a day, these have been nonbloody.  She has still been a bit nauseated but has not had any additional vomiting.  No recent antibiotics.  No history of similar and no history of diverticulitis or abscess.  No previous abdominal surgeries aside from a tubal ligation.  No other aggravating or alleviating factors.        Past Medical History:  Diagnosis Date  . Asthma   . Carpal tunnel syndrome   . Heart murmur   . Heart palpitations   . High cholesterol   . Hypertension     Patient Active Problem List   Diagnosis Date Noted  . Ankle pain, chronic 09/21/2013  . Dry eye syndrome 06/02/2013  . Family history of premature CAD 07/26/2012  . Carpal tunnel syndrome on right 01/21/2011  . HTN (hypertension) 01/21/2011    Past Surgical History:  Procedure Laterality Date  . TUBAL LIGATION       OB History   No obstetric history on file.     Family History  Problem Relation Age of Onset  .  Diabetes Mother   . Hypertension Mother     Social History   Tobacco Use  . Smoking status: Never Smoker  . Smokeless tobacco: Never Used  Vaping Use  . Vaping Use: Never used  Substance Use Topics  . Alcohol use: No  . Drug use: No    Home Medications Prior to Admission medications   Medication Sig Start Date End Date Taking? Authorizing Provider  acetaminophen (TYLENOL) 500 MG tablet Take 1,000 mg by mouth every 6 (six) hours as needed for mild pain.   Yes [provider]  amLODipine (NORVASC) 2.5 MG tablet Take by mouth. 05/24/20  Yes [provider]  cetirizine (ZYRTEC) 10 MG tablet Take 1 tablet (10 mg total) by mouth daily. 12/29/19  Yes Jaynee Eagles, PA-C  dicyclomine (BENTYL) 20 MG tablet Take 1 tablet (20 mg total) by mouth 2 (two) times daily. 11/03/20  Yes Garald Balding, PA-C  ondansetron (ZOFRAN ODT) 4 MG disintegrating tablet Take 1 tablet (4 mg total) by mouth every 8 (eight) hours as needed for nausea or vomiting. 11/03/20  Yes Garald Balding, PA-C  colchicine 0.6 MG tablet Take 1 tablet (0.6 mg total) by mouth daily as needed. For hand pain Patient not taking: Reported on 11/06/2020 08/28/19   Forrest Moron, MD  mometasone (NASONEX) 50 MCG/ACT nasal spray Place 1 spray into the nose daily.  Patient not taking: Reported on 11/06/2020 06/08/19   Forrest Moron, MD    Allergies    Codeine, Morphine and related, Latex, Lisinopril, and Lovastatin  Review of Systems   Review of Systems  Constitutional: Positive for fatigue. Negative for chills and fever.  Respiratory: Negative for cough and shortness of breath.   Cardiovascular: Negative for chest pain.  Gastrointestinal: Positive for abdominal pain, diarrhea and nausea. Negative for blood in stool and vomiting.  Genitourinary: Negative for dysuria, frequency, vaginal bleeding and vaginal discharge.  Musculoskeletal: Positive for myalgias.  Neurological: Positive for headaches.  All other systems  reviewed and are negative.   Physical Exam Updated Vital Signs BP (!) 160/97 (BP Location: Left Arm)   Pulse 68   Temp 99.2 F (37.3 C) (Oral)   Resp 17   Ht 5' (1.524 m)   Wt 70.8 kg   LMP 12/29/2012   SpO2 98%   BMI 30.47 kg/m   Physical Exam Vitals and nursing note reviewed.  Constitutional:      General: She is not in acute distress.    Appearance: She is well-developed. She is not ill-appearing or diaphoretic.     Comments: Well-appearing and in no distress   HENT:     Head: Normocephalic and atraumatic.     Mouth/Throat:     Mouth: Mucous membranes are moist.     Pharynx: Oropharynx is clear.  Eyes:     General:        Right eye: No discharge.        Left eye: No discharge.     Pupils: Pupils are equal, round, and reactive to light.  Cardiovascular:     Rate and Rhythm: Normal rate and regular rhythm.     Heart sounds: Normal heart sounds. No murmur heard. No friction rub. No gallop.   Pulmonary:     Effort: Pulmonary effort is normal. No respiratory distress.     Breath sounds: Normal breath sounds. No wheezing or rales.     Comments: Respirations equal and unlabored, patient able to speak in full sentences, lungs clear to auscultation bilaterally  Abdominal:     General: Bowel sounds are normal. There is no distension.     Palpations: Abdomen is soft. There is no mass.     Tenderness: There is generalized abdominal tenderness and tenderness in the right upper quadrant and right lower quadrant. There is no right CVA tenderness, left CVA tenderness or guarding.     Comments: Abdomen is soft, nondistended, bowel sounds present throughout, there is mild generalized tenderness, but pain most severe in the right upper and lower quadrants, no guarding or peritoneal signs.  Musculoskeletal:        General: No deformity.     Cervical back: Neck supple.  Skin:    General: Skin is warm and dry.     Capillary Refill: Capillary refill takes less than 2 seconds.   Neurological:     Mental Status: She is alert.     Coordination: Coordination normal.     Comments: Speech is clear, able to follow commands Moves extremities without ataxia, coordination intact  Psychiatric:        Mood and Affect: Mood normal.        Behavior: Behavior normal.     ED Results / Procedures / Treatments   Labs (all labs ordered are listed, but only abnormal results are displayed) Labs Reviewed  COMPREHENSIVE METABOLIC PANEL - Abnormal; Notable for the following components:  Result Value   Glucose, Bld 104 (*)    Calcium 8.8 (*)    All other components within normal limits  CBC WITH DIFFERENTIAL/PLATELET - Abnormal; Notable for the following components:   WBC 3.7 (*)    All other components within normal limits  URINALYSIS, ROUTINE W REFLEX MICROSCOPIC - Abnormal; Notable for the following components:   Hgb urine dipstick SMALL (*)    Bacteria, UA RARE (*)    All other components within normal limits  LIPASE, BLOOD    EKG None  Radiology US Pelvis Complete  Result Date: 11/06/2020 CLINICAL DATA:  Possible hydrosalpinx. EXAM: TRANSABDOMINAL ULTRASOUND OF PELVIS TECHNIQUE: Transabdominal ultrasound examination of the pelvis was performed including evaluation of the uterus, ovaries, adnexal regions, and pelvic cul-de-sac. COMPARISON:  CT of same day. FINDINGS: Uterus Measurements: 8.6 x 6.9 x 6.0 cm = volume: 185 mL. Multiple fibroids are noted, with the largest measuring 6 cm involving the uterine fundus. Left-sided fibroid measuring 4.4 cm is noted. Endometrium Thickness: 6 mm which is within normal limits. No focal abnormality visualized. Right ovary Measurements: 3.7 x 2.8 x 2.8 cm = volume: 15 mL. Serpiginous tubular structure is noted in the right adnexal region concerning for hydrosalpinx. Left ovary Measurements: 3.6 x 2.8 x 3.3 cm = volume: 17 mL. Tubular structure is also noted in the left adnexal region which may represent hydrosalpinx. Other findings:   No abnormal free fluid. IMPRESSION: Multiple uterine fibroids are noted, the largest measuring 6 cm. Probable bilateral hydrosalpinges are noted. Electronically Signed   By: Marijo Conception M.D.   On: 11/06/2020 15:15   CT ABDOMEN PELVIS W CONTRAST  Result Date: 11/06/2020 CLINICAL DATA:  Nonlocalized abdominal pain. EXAM: CT ABDOMEN AND PELVIS WITH CONTRAST TECHNIQUE: Multidetector CT imaging of the abdomen and pelvis was performed using the standard protocol following bolus administration of intravenous contrast. CONTRAST:  145mL OMNIPAQUE IOHEXOL 300 MG/ML  SOLN COMPARISON:  None. FINDINGS: Lower chest: Unremarkable. Hepatobiliary: 3 mm hypodensity in the lateral segment left liver is too small to characterize but statistically is most likely benign. There is no evidence for gallstones, gallbladder wall thickening, or pericholecystic fluid. No intrahepatic or extrahepatic biliary dilation. Pancreas: No focal mass lesion. No dilatation of the main duct. No intraparenchymal cyst. No peripancreatic edema. Spleen: No splenomegaly. No focal mass lesion. Adrenals/Urinary Tract: No adrenal nodule or mass. Central sinus cysts noted in both kidneys. No evidence for hydroureter. The urinary bladder appears normal for the degree of distention. Stomach/Bowel: Stomach is unremarkable. No gastric wall thickening. No evidence of outlet obstruction. Duodenum is normally positioned as is the ligament of Treitz. No small bowel wall thickening. No small bowel dilatation. The terminal ileum is normal. The appendix is normal. No gross colonic mass. No colonic wall thickening. Vascular/Lymphatic: No abdominal aortic aneurysm. No abdominal lymphadenopathy. No pelvic lymphadenopathy. Reproductive: Multiple uterine fibroids again noted, including a dominant exophytic right fundal lesion. Fluid density in the right adnexal space has a somewhat tubular configuration (axial 51/2) that does not appear to represent fluid-filled small  bowel. Hydrosalpinx would be a consideration. No left adnexal mass. Other: No intraperitoneal free fluid. Musculoskeletal: No worrisome lytic or sclerotic osseous abnormality. Mild anterolisthesis of L5 on S1 is stable. IMPRESSION: 1. Fluid density in the right adnexal space has a somewhat tubular configuration that does not appear to represent fluid-filled small bowel. Hydrosalpinx would be a consideration. Pelvic ultrasound could be used to further evaluate. 2. Otherwise no acute findings in the abdomen or  pelvis. 3. Uterine fibroids. Electronically Signed   By: Misty Stanley M.D.   On: 11/06/2020 12:33   US Abdomen Limited RUQ (LIVER/GB)  Result Date: 11/06/2020 CLINICAL DATA:  Upper abdominal pain EXAM: ULTRASOUND ABDOMEN LIMITED RIGHT UPPER QUADRANT COMPARISON:  CT abdomen and pelvis September 05, 2014. FINDINGS: Gallbladder: No gallstones or wall thickening visualized. There is no pericholecystic fluid. No sonographic Murphy sign noted by sonographer. Common bile duct: Diameter: 5 mm. No intrahepatic or extrahepatic biliary duct dilatation. Liver: No focal lesion identified. Within normal limits in parenchymal echogenicity. Portal vein is patent on color Doppler imaging with normal direction of blood flow towards the liver. Other: There is a cyst in the mid right kidney measuring 1.7 x 1.5 x 2.0 cm. Extrarenal pelvis noted on the right. Borderline fullness of the right renal collecting system appear stable compared to 2016 CT examination. IMPRESSION: Cyst right kidney. Borderline fullness of right renal collecting system appears chronic and stable. Study otherwise unremarkable. Electronically Signed   By: Lowella Grip III M.D.   On: 11/06/2020 09:30    Procedures Procedures   Medications Ordered in ED Medications  morphine 4 MG/ML injection 4 mg (4 mg Intravenous Patient Refused/Not Given 11/06/20 0900)  sodium chloride 0.9 % bolus 1,000 mL (0 mLs Intravenous Stopped 11/06/20 1344)  ondansetron  (ZOFRAN) injection 4 mg (4 mg Intravenous Given 11/06/20 0900)  iohexol (OMNIPAQUE) 300 MG/ML solution 100 mL (100 mLs Intravenous Contrast Given 11/06/20 1145)    ED Course  I have reviewed the triage vital signs and the nursing notes.  Pertinent labs & imaging results that were available during my care of the patient were reviewed by me and considered in my medical decision making (see chart for details).    MDM Rules/Calculators/A&P                         Patient presents to the ED with complaints of abdominal pain. Patient nontoxic appearing, in no apparent distress, vitals WNL. On exam patient tender to in the RUQ and RLQ, with some mild periumbilical tenderness, no peritoneal signs. Will evaluate with labs and right upper quadrant ultrasound, if negative will likely need CT. Analgesics, antiemetics and fluids ordered, patient on any pain medication at this time.  Additional history obtained:  Additional history obtained from chart review & nursing note review.   Lab Tests:  I Ordered, reviewed, and interpreted labs, which included:  CBC: Mild leukopenia, CMP: Glucose 104, mild hypocalcemia, no other electrolyte derangements, normal renal and liver function. Lipase: WNL UA: Rare bacteria, no other signs of infection, no urinary symptoms  Imaging Studies ordered:  I ordered imaging studies which included RUQ [place], CT abdomen pelvis, and Pelvic US, I independently reviewed, formal radiology impression shows:   RUQ [place]: No evidence of cholecystitis, no gallstones or wall thickening noted, common bile duct WNL, right kidney cyst noted stable compared to CT of 2016  CT: Fluid density in the right adnexal space that has a somewhat tubular configuration, does not appear to represent fluid-filled small bowel, hydrosalpinx could be a consideration, pelvic ultrasound recommended to further evaluate, no other acute findings in abdomen or pelvis.  Uterine fibroids noted.  Pelvic ultrasound:  Multiple uterine fibroids are noticed, the largest measuring 6 cm, probable bilateral hydrosalpinges are noted.  I discussed pelvic ultrasound findings with patient.  She is not sexually active, is not having discharge, and I have low suspicion for PID or TOA.  We  will have her follow-up with OB/GYN regarding pelvic ultrasound findings, but feel they are likely unrelated to her symptoms.  ED Course:   RE-EVAL: Symptoms have significantly improved while here in the ED and patient is feeling much better.  I discussed her reassuring work-up in detail, lab work overall looks good, CT does not show signs of enteritis or colitis and I am reassured that diarrhea has seemed to slow down today and is finally improving, she is tolerating p.o.  Patient is not sexually active, and I have low suspicion that hydrosalpinx seen on ultrasound is in the setting of PID, especially without surrounding inflammatory changes.  Patient has multiple uterine fibroids and reports a known history of this.  She will follow-up with Dr. Garwin Brothers, her OB/GYN regarding these findings on ultrasound but I have low suspicion that they are contributing to her symptoms today.  On repeat abdominal exam patient remains without peritoneal signs, low suspicion for cholecystitis, pancreatitis, diverticulitis, appendicitis, bowel obstruction/perforation, or other acute surgical process. Patient tolerating PO in the emergency department. Will discharge home with supportive measures. I discussed results, treatment plan, need for PCP follow-up, and return precautions with the patient. Provided opportunity for questions, patient confirmed understanding and is in agreement with plan.    Portions of this note were generated with [employer]. Dictation errors may occur despite best attempts at proofreading.  Final Clinical Impression(s) / ED Diagnoses Final diagnoses:  Diarrhea, unspecified type  Generalized abdominal pain  Nausea   Hydrosalpinx  Uterine leiomyoma, unspecified location    Rx / DC Orders ED Discharge Orders    None       Jacqlyn Larsen, [place] 11/10/20 1402    Charlesetta Shanks, MD 11/13/20 1254

## 2020-11-06 NOTE — ED Notes (Signed)
Patient ambulatory to restroom without assistance. 

## 2020-11-06 NOTE — ED Notes (Signed)
Patient provided gingerale. Will call ultrasound when patient feels full.

## 2020-11-09 NOTE — Progress Notes (Signed)
   Covid-19 Vaccination Clinic  Name:  TERRANCE LANAHAN    MRN: [id number] DOB: 1956/02/14  11/09/2020  Ms. Wilmeth was observed post Covid-19 immunization for 15 minutes without incident. She was provided with Vaccine Information Sheet and instruction to access the V-Safe system.   Ms. Guilbault was instructed to call 911 with any severe reactions post vaccine: Marland Kitchen Difficulty breathing  . Swelling of face and throat  . A fast heartbeat  . A bad rash all over body  . Dizziness and weakness   Immunizations Administered    Name Date Dose VIS Date Route   Moderna Covid-19 Booster Vaccine 07/18/2020  3:00 PM 0.25 mL 05/02/2020 Intramuscular   Manufacturer: Moderna   Lot: [id number]   [place]: [id number]

## 2020-12-06 DIAGNOSIS — M25571 Pain in right ankle and joints of right foot: Secondary | ICD-10-CM | POA: Insufficient documentation

## 2020-12-07 ENCOUNTER — Other Ambulatory Visit: Payer: Self-pay

## 2020-12-07 ENCOUNTER — Encounter: Payer: Self-pay | Admitting: Podiatry

## 2020-12-07 ENCOUNTER — Ambulatory Visit (INDEPENDENT_AMBULATORY_CARE_PROVIDER_SITE_OTHER): Payer: BC Managed Care – PPO

## 2020-12-07 ENCOUNTER — Encounter: Payer: Self-pay | Admitting: *Deleted

## 2020-12-07 ENCOUNTER — Telehealth: Payer: Self-pay | Admitting: *Deleted

## 2020-12-07 ENCOUNTER — Ambulatory Visit: Payer: BC Managed Care – PPO | Admitting: Podiatry

## 2020-12-07 DIAGNOSIS — M7750 Other enthesopathy of unspecified foot: Secondary | ICD-10-CM

## 2020-12-07 DIAGNOSIS — M7751 Other enthesopathy of right foot: Secondary | ICD-10-CM | POA: Diagnosis not present

## 2020-12-07 DIAGNOSIS — M76821 Posterior tibial tendinitis, right leg: Secondary | ICD-10-CM

## 2020-12-07 MED ORDER — METHYLPREDNISOLONE 4 MG PO TBPK: ORAL_TABLET | ORAL | 0 refills | Status: DC

## 2020-12-07 NOTE — Progress Notes (Signed)
Subjective:   Patient ID: Brenda Peters, female   DOB: 65 y.o.   MRN: [id number]   HPI Patient states she is developed inflammation in the inside of the right ankle and she got bit by some fire ants in her right lower leg and its been inflamed since then and now it feels like the tendon is sore.  Also complaining of painful bunion deformities bilateral   ROS      Objective:  Physical Exam  Neurovascular status intact with inflammation of the inside of the right ankle around the posterior tibial tendon with no indication of tendon dysfunction also has significant bunion deformities which are bothersome     Assessment:  Inflammatory tendinitis with reactivity possibly from bite in the right lower leg with moderate flatfoot deformity and significant structural bunion deformity bilateral with redness and pain and moderate hypermobility with good range of motion first MPJ     Plan:  H&P reviewed all conditions and she does want bunion correction of advised to see her back 3 to 4 weeks before she decides to have it fixed.  For this inflammation I did do sterile prep and injected the sheath of the right posterior tib 3 mg Dexasone Kenalog 5 mg Xylocaine placed on 12-day Sterapred DS Dosepak and instructed on ice therapy.  Reappoint to recheck  X-rays indicate that there is moderate flatfoot deformity bilateral with large bunion deformity bilateral

## 2020-12-07 NOTE — Telephone Encounter (Signed)
That is fine 

## 2020-12-07 NOTE — Telephone Encounter (Signed)
Called and informed patient that her out of work letter was approved and is ready for pick up from front desk, [employer] office.She verbalized understanding.

## 2020-12-07 NOTE — Telephone Encounter (Signed)
Patient is calling to request an out of work letter from [employer].5/31-Fri. 6/3,not able to perform duties(a lot of standing) at her job level. She said that she had talk to doctor about this during today's appointment.

## 2021-01-04 ENCOUNTER — Encounter: Payer: Self-pay | Admitting: Podiatry

## 2021-01-04 ENCOUNTER — Ambulatory Visit: Payer: BC Managed Care – PPO | Admitting: Podiatry

## 2021-01-04 ENCOUNTER — Other Ambulatory Visit: Payer: Self-pay

## 2021-01-04 DIAGNOSIS — M76821 Posterior tibial tendinitis, right leg: Secondary | ICD-10-CM | POA: Diagnosis not present

## 2021-01-04 DIAGNOSIS — M76822 Posterior tibial tendinitis, left leg: Secondary | ICD-10-CM | POA: Diagnosis not present

## 2021-01-04 DIAGNOSIS — M7750 Other enthesopathy of unspecified foot: Secondary | ICD-10-CM

## 2021-01-04 MED ORDER — DICLOFENAC SODIUM 75 MG PO TBEC: 75.0000 mg | DELAYED_RELEASE_TABLET | Freq: Two times a day (BID) | ORAL | 2 refills | Status: DC

## 2021-01-06 NOTE — Progress Notes (Signed)
Subjective:   Patient ID: Brenda Peters, female   DOB: 65 y.o.   MRN: [id number]   HPI Patient presents stating she is still having a lot of pain in her right ankle and states that she did have some improvement when she initially was treated but its come back with full vengeance and she knows that she needs some kind of support therapy due to her flatfeet   ROS      Objective:  Physical Exam  Neurovascular status intact with patient found to have depression of the arch bilateral right over left with inflammation of the posterior tibial insertion along with gait instability.  Patient found to have good digital perfusion well oriented to time     Assessment:  Acute posterior tibial tendinitis right with flattening of the arch with patient found to have what appears to be no muscle damage     Plan:  H&P reviewed condition and did apply an air fracture walker to try to get this weekly reduced.  I then went ahead today and I casted for functional orthotics with all instructions on usage     Patient presents at

## 2021-01-10 ENCOUNTER — Ambulatory Visit: Payer: BC Managed Care – PPO | Admitting: Podiatry

## 2021-01-30 ENCOUNTER — Other Ambulatory Visit: Payer: Self-pay

## 2021-01-30 ENCOUNTER — Ambulatory Visit (INDEPENDENT_AMBULATORY_CARE_PROVIDER_SITE_OTHER): Payer: BC Managed Care – PPO | Admitting: Podiatry

## 2021-01-30 DIAGNOSIS — M7750 Other enthesopathy of unspecified foot: Secondary | ICD-10-CM

## 2021-01-30 DIAGNOSIS — M76821 Posterior tibial tendinitis, right leg: Secondary | ICD-10-CM

## 2021-01-30 NOTE — Patient Instructions (Signed)

## 2021-01-30 NOTE — Progress Notes (Signed)
Patient presents today for orthotic pick up. Patient voices no new complaints.   Orthotics were fitted to patient's feet. No discomfort and no rubbing. Patient satisfied with the orthotics.   Orthotics were dispensed to patient with instructions for break in wear and to call the office if any concerns or questions.  

## 2021-03-29 ENCOUNTER — Other Ambulatory Visit: Payer: Self-pay | Admitting: Podiatry

## 2021-03-29 ENCOUNTER — Ambulatory Visit: Payer: BC Managed Care – PPO | Admitting: Podiatry

## 2021-03-29 ENCOUNTER — Ambulatory Visit (INDEPENDENT_AMBULATORY_CARE_PROVIDER_SITE_OTHER): Payer: BC Managed Care – PPO | Admitting: Podiatry

## 2021-03-29 ENCOUNTER — Ambulatory Visit (INDEPENDENT_AMBULATORY_CARE_PROVIDER_SITE_OTHER): Payer: BC Managed Care – PPO

## 2021-03-29 ENCOUNTER — Encounter: Payer: Self-pay | Admitting: Podiatry

## 2021-03-29 ENCOUNTER — Other Ambulatory Visit: Payer: Self-pay

## 2021-03-29 DIAGNOSIS — M7752 Other enthesopathy of left foot: Secondary | ICD-10-CM

## 2021-03-29 DIAGNOSIS — M21619 Bunion of unspecified foot: Secondary | ICD-10-CM

## 2021-03-29 DIAGNOSIS — M79672 Pain in left foot: Secondary | ICD-10-CM

## 2021-03-29 MED ORDER — TRIAMCINOLONE ACETONIDE 10 MG/ML IJ SUSP
10.0000 mg | Freq: Once | INTRAMUSCULAR | Status: AC
  Administered 2021-03-29: 10 mg

## 2021-03-29 NOTE — Progress Notes (Signed)
Subjective:   Patient ID: Brenda Peters, female   DOB: 65 y.o.   MRN: [id number]   HPI Patient states her right ankle is improving but her left ankle has become very tender in the outside and she likes her orthotics and is also concerned about her bunions and wants to consider correction later this year neuro   ROS      Objective:  Physical Exam  Vascular status intact with inflammation pain around the sinus tarsi left with flatfoot deformity bilateral and posterior tibial tendinitis right improving.  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 with significant matrix deformity and posterior tibial tendinitis right     Plan:  H&P reviewed all 3 conditions and did discuss this would require proximal fusion for the left first metatarsal with distal osteotomy right and today I did sterile prep and injected the sinus tarsi 3 mg Kenalog 5 mg Xylocaine and will reappoint before deciding when she wants surgical intervention for her feet with the right one to be done 6 weeks after the left foot  X-rays indicate that there is significant structural deformity left with elevation of the intermetatarsal angle high note I did not note any midtarsal joint or subtalar joint arthritis or fracture

## 2021-05-13 ENCOUNTER — Telehealth: Payer: Self-pay | Admitting: *Deleted

## 2021-05-13 NOTE — Telephone Encounter (Signed)
Patient is calling for left foot pain, has been scheduled 05/14/21.

## 2021-05-14 ENCOUNTER — Ambulatory Visit: Payer: BC Managed Care – PPO | Admitting: Podiatry

## 2021-05-14 ENCOUNTER — Encounter: Payer: Self-pay | Admitting: Podiatry

## 2021-05-14 ENCOUNTER — Other Ambulatory Visit: Payer: Self-pay

## 2021-05-14 DIAGNOSIS — M7750 Other enthesopathy of unspecified foot: Secondary | ICD-10-CM | POA: Diagnosis not present

## 2021-05-14 DIAGNOSIS — M7752 Other enthesopathy of left foot: Secondary | ICD-10-CM

## 2021-05-14 MED ORDER — MELOXICAM 15 MG PO TABS: 15.0000 mg | ORAL_TABLET | Freq: Every day | ORAL | 0 refills | Status: DC

## 2021-05-14 MED ORDER — DEXAMETHASONE SODIUM PHOSPHATE 120 MG/30ML IJ SOLN
4.0000 mg | Freq: Once | INTRAMUSCULAR | Status: AC
  Administered 2021-05-14: 4 mg via INTRA_ARTICULAR

## 2021-05-14 NOTE — Progress Notes (Signed)
  Subjective:  Patient ID: Brenda Peters, female    DOB: [birth year],   MRN: [id number]  Chief Complaint  Patient presents with   Capsulitis    Left ankle and foot capsulitis. PT state she did a lot of walking this past weekend that made her left ankle inflamed.     65 y.o. female presents for concern of continued and increased pain in her left foot and ankle. Relates she has been on her feet more recently and has increased swelling and tenderness on the outside of the left foot. Has a history of gout. Also has bunions but denies any pain . Denies any other pedal complaints. Denies n/v/f/c.   Past Medical History:  Diagnosis Date   Asthma    Carpal tunnel syndrome    Heart murmur    Heart palpitations    High cholesterol    Hypertension     Objective:  Physical Exam: Vascular: DP/PT pulses 2/4 bilateral. CFT <3 seconds. Normal hair growth on digits. No edema.  Skin. No lacerations or abrasions bilateral feet. Erythema and edema noted to lateral aspect of the foot.  Musculoskeletal: MMT 5/5 bilateral lower extremities in DF, PF, Inversion and Eversion. Deceased ROM in DF of ankle joint. Tender to insertion of peroneal tendon. Tender in sinus tarsi area.  Neurological: Sensation intact to light touch.   Assessment:   1. Capsulitis of left ankle   2. Tendonitis of ankle or foot      Plan:  Patient was evaluated and treated and all questions answered. -Xrays reviewed -Discussed treatement options for gouty arthritis and gout education provided. -Patient opted for injection. After oral consent, injected left lateral fifth tarsometatarsal joint with 1cc lidocaine and marcaine plain mixed with 1cc Dexmethasone phosphate without complication; post injection care explained. -Discussed diet and modifications.  Meloxicam prescribed.   -Ordered arthritic lab panel; will call patient with results if abnormal -Advised patient to call if symptoms are not improved within 1 week -Patient  to return in 3 weeks for re-check/further discussion for long term management of gout or sooner if condition worsens.    Lorenda Peck, DPM

## 2021-05-15 ENCOUNTER — Encounter: Payer: Self-pay | Admitting: Podiatry

## 2021-05-15 ENCOUNTER — Telehealth: Payer: Self-pay | Admitting: *Deleted

## 2021-05-15 NOTE — Telephone Encounter (Signed)
Patient is requesting a note for work for the next 2 days because of treatment received 1 day ago.Please advise.

## 2021-05-17 ENCOUNTER — Encounter: Payer: Self-pay | Admitting: Podiatry

## 2021-05-17 ENCOUNTER — Other Ambulatory Visit: Payer: Self-pay

## 2021-05-17 ENCOUNTER — Ambulatory Visit (INDEPENDENT_AMBULATORY_CARE_PROVIDER_SITE_OTHER): Payer: BC Managed Care – PPO | Admitting: Podiatry

## 2021-05-17 DIAGNOSIS — M7752 Other enthesopathy of left foot: Secondary | ICD-10-CM | POA: Diagnosis not present

## 2021-05-17 DIAGNOSIS — R600 Localized edema: Secondary | ICD-10-CM

## 2021-05-17 MED ORDER — TRIAMCINOLONE ACETONIDE 10 MG/ML IJ SUSP
10.0000 mg | Freq: Once | INTRAMUSCULAR | Status: AC
  Administered 2021-05-17: 10 mg

## 2021-05-17 MED ORDER — PREDNISONE 10 MG PO TABS: ORAL_TABLET | ORAL | 0 refills | Status: DC

## 2021-05-18 LAB — ARTHRITIS PANEL
Anti Nuclear Antibody (ANA): NEGATIVE
Rheumatoid fact SerPl-aCnc: <10 IU/mL (ref <14.0)
Sed Rate: 19 mm/hr (ref 0–40)
Uric Acid: 7 mg/dL (ref 3.0–7.2)

## 2021-05-20 NOTE — Progress Notes (Signed)
Subjective:   Patient ID: Brenda Peters, female   DOB: 65 y.o.   MRN: [id number]   HPI Patient states she is moderately improved still having discomfort in her left foot and does still have some swelling in her left ankle   ROS      Objective:  Physical Exam  Neurovascular status intact with discomfort which is centered mostly in the sinus tarsi ankle joint left with moderate edema negative Homans' sign noted     Assessment:  Inflammatory capsulitis of the ankle and subtalar joint left with inflammation fluid buildup along with swelling associated with condition     Plan:  H&P reviewed condition and went ahead today did a sterile prep and injected the sinus tarsi and into the ankle joint left 3 mg Kenalog 5 mg Xylocaine and dispensed a ankle compression stocking to take stress off the foot.  Reappoint for [place] to recheck

## 2021-05-22 ENCOUNTER — Telehealth: Payer: Self-pay | Admitting: *Deleted

## 2021-05-22 NOTE — Telephone Encounter (Signed)
Patient is calling because she is having side effects from the prescribed prednisone-10 mg.She is experiencing elevated blood pressures,chest, back and neck pressure,unable to sleep or drive.(Taking a total of 6 pills daily-2 in mornings,1 at lunch an supper and 2 at bedtime)Please advise.

## 2021-05-23 NOTE — Telephone Encounter (Signed)
Patient called this morning stating she stopped the medication she was given, because she was having a reaction to it,  it was causing her troubles breathing. (Got disconnected from patient before I got name of medicine)

## 2021-05-23 NOTE — Telephone Encounter (Signed)
That is fine 

## 2021-05-24 NOTE — Telephone Encounter (Signed)
Spoke with patient this morning, she is going to see how she feels this weekend, and if she needs an earlier appointment she will call us on Monday, otherwise she will see Dr Paulla Dolly on Friday of next week.

## 2021-05-30 ENCOUNTER — Telehealth: Payer: Self-pay | Admitting: Podiatry

## 2021-05-30 NOTE — Telephone Encounter (Signed)
That is fine. She can have a note restricting her standing

## 2021-05-30 NOTE — Telephone Encounter (Signed)
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  Please Advise  patient would like to pick up the note when it is ready.   She returned to work on Monday and she is still having a lot of pain.

## 2021-05-31 ENCOUNTER — Encounter (HOSPITAL_COMMUNITY): Payer: Self-pay | Admitting: Emergency Medicine

## 2021-05-31 ENCOUNTER — Ambulatory Visit: Payer: BC Managed Care – PPO | Admitting: Podiatry

## 2021-05-31 ENCOUNTER — Emergency Department (HOSPITAL_COMMUNITY)
Admission: EM | Admit: 2021-05-31 | Discharge: 2021-05-31 | Disposition: A | Discharge status: Home / Self Care | Payer: BC Managed Care – PPO | Attending: Emergency Medicine | Admitting: Emergency Medicine

## 2021-05-31 ENCOUNTER — Encounter: Payer: Self-pay | Admitting: Podiatry

## 2021-05-31 ENCOUNTER — Other Ambulatory Visit: Payer: Self-pay

## 2021-05-31 DIAGNOSIS — Z91040 Latex allergy status: Secondary | ICD-10-CM | POA: Diagnosis not present

## 2021-05-31 DIAGNOSIS — I10 Essential (primary) hypertension: Secondary | ICD-10-CM | POA: Diagnosis not present

## 2021-05-31 DIAGNOSIS — M545 Low back pain, unspecified: Secondary | ICD-10-CM | POA: Diagnosis present

## 2021-05-31 DIAGNOSIS — M5442 Lumbago with sciatica, left side: Secondary | ICD-10-CM | POA: Insufficient documentation

## 2021-05-31 DIAGNOSIS — J45909 Unspecified asthma, uncomplicated: Secondary | ICD-10-CM | POA: Insufficient documentation

## 2021-05-31 DIAGNOSIS — R42 Dizziness and giddiness: Secondary | ICD-10-CM | POA: Diagnosis not present

## 2021-05-31 DIAGNOSIS — Z79899 Other long term (current) drug therapy: Secondary | ICD-10-CM | POA: Insufficient documentation

## 2021-05-31 DIAGNOSIS — X500XXA Overexertion from strenuous movement or load, initial encounter: Secondary | ICD-10-CM | POA: Diagnosis not present

## 2021-05-31 MED ORDER — LIDOCAINE 5 % EX PTCH: 1.0000 | MEDICATED_PATCH | CUTANEOUS | 0 refills | Status: DC

## 2021-05-31 MED ORDER — KETOROLAC TROMETHAMINE 10 MG PO TABS: 10.0000 mg | ORAL_TABLET | Freq: Four times a day (QID) | ORAL | 0 refills | Status: DC | PRN

## 2021-05-31 MED ORDER — METHOCARBAMOL 500 MG PO TABS: 500.0000 mg | ORAL_TABLET | Freq: Two times a day (BID) | ORAL | 0 refills | Status: DC

## 2021-05-31 MED ORDER — IBUPROFEN 800 MG PO TABS
800.0000 mg | ORAL_TABLET | Freq: Once | ORAL | Status: AC
  Administered 2021-05-31: 800 mg via ORAL
  Filled 2021-05-31: qty 1

## 2021-05-31 NOTE — ED Triage Notes (Signed)
Patient reports waking up today with L sided lower back and buttock pain that shoots down her L leg. Hx of sciatic nerve 'episodes.' No hx of gout.

## 2021-05-31 NOTE — ED Provider Notes (Signed)
[facility] DEPT Provider Note   CSN: [id number] Arrival date & time: 05/31/21  8828     History No chief complaint on file.   Brenda Peters is a 65 y.o. female with a past medical history of lower back pain, sciatica who presents with 1 day of left lower back, buttock pain that shoots down her left leg.  Patient reports that she has been dealing with other orthopedic problems including right foot pain, and was recently placed on a steroid taper which she did not tolerate, reporting that she thinks that she may been allergic very lightheaded, felt dizzy and generally did not tolerate the medication.  Patient has not tried anything for the lower back pain at this time.  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  Reports pain is 7/10 at this time.  Patient denies numbness, tingling in the legs, groin.  Patient denies history of IV drug use, chronic corticosteroid use, history of cancer, recent fever.  HPI     Past Medical History:  Diagnosis Date   Asthma    Carpal tunnel syndrome    Heart murmur    Heart palpitations    High cholesterol    Hypertension     Patient Active Problem List   Diagnosis Date Noted   Cervical spondylosis without myelopathy 09/09/2019   Ankle pain, chronic 09/21/2013   Dry eye syndrome 06/02/2013   Family history of premature CAD 07/26/2012   Carpal tunnel syndrome on right 01/21/2011   HTN (hypertension) 01/21/2011    Past Surgical History:  Procedure Laterality Date   TUBAL LIGATION       OB History   No obstetric history on file.     Family History  Problem Relation Age of Onset   Diabetes Mother    Hypertension Mother     Social History   Tobacco Use   Smoking status: Never   Smokeless tobacco: Never  Vaping Use   Vaping Use: Never used  Substance Use Topics   Alcohol use: No   Drug use: No    Home Medications Prior to Admission medications   Medication  Sig Start Date End Date Taking? Authorizing Provider  ketorolac (TORADOL) 10 MG tablet Take 1 tablet (10 mg total) by mouth every 6 (six) hours as needed. 05/31/21  Yes Flor Whitacre H, PA-C  lidocaine (LIDODERM) 5 % Place 1 patch onto the skin daily. Remove & Discard patch within 12 hours or as directed by MD 05/31/21  Yes Shalika Arntz H, PA-C  methocarbamol (ROBAXIN) 500 MG tablet Take 1 tablet (500 mg total) by mouth 2 (two) times daily. 05/31/21  Yes Elira Colasanti H, PA-C  acetaminophen (TYLENOL) 500 MG tablet Take 1,000 mg by mouth every 6 (six) hours as needed for mild pain.    [provider]  amLODipine (NORVASC) 2.5 MG tablet Take by mouth. 05/24/20   [provider]  azelastine (ASTELIN) 0.1 % nasal spray SMARTSIG:1-2 Spray(s) Both Nares 1-2 Times Daily 05/13/21   [provider]  betamethasone dipropionate 0.05 % cream Apply 1 application topically 2 (two) times daily. 12/04/20   [provider]  cetirizine (ZYRTEC) 10 MG tablet Take 1 tablet (10 mg total) by mouth daily. 12/29/19   Jaynee Eagles, PA-C  dicyclomine (BENTYL) 20 MG tablet Take 1 tablet (20 mg total) by mouth 2 (two) times daily. 11/03/20   Garald Balding, PA-C  ergocalciferol (VITAMIN D2) 1.25 MG (50000 UT) capsule  Take 1 capsule by mouth once a week. 09/22/13   [provider]  hydrochlorothiazide (HYDRODIURIL) 12.5 MG tablet Take 12.5 mg by mouth every morning. 08/04/20   [provider]  ibuprofen (ADVIL) 600 MG tablet Take 1,200 mg by mouth 2 (two) times daily as needed. 08/11/20   [provider]  meloxicam (MOBIC) 15 MG tablet Take 1 tablet (15 mg total) by mouth daily. 05/14/21   Lorenda Peck, MD  ondansetron (ZOFRAN ODT) 4 MG disintegrating tablet Take 1 tablet (4 mg total) by mouth every 8 (eight) hours as needed for nausea or vomiting. 11/03/20   Garald Balding, PA-C  traMADol (ULTRAM) 50 MG tablet Take 50 mg by mouth every 8 (eight) hours  as needed. 09/13/20   [provider]    Allergies    Codeine, Morphine and related, Prednisone, Latex, Lisinopril, and Lovastatin  Review of Systems   Review of Systems  Musculoskeletal:  Positive for back pain.  All other systems reviewed and are negative.  Physical Exam Updated Vital Signs BP 139/87 (BP Location: Right Arm)   Pulse 96   Temp 98.7 F (37.1 C) (Oral)   Resp 16   Ht 5' (1.524 m)   Wt 69.9 kg   LMP 12/29/2012   SpO2 98%   BMI 30.08 kg/m   Physical Exam Vitals and nursing note reviewed.  Constitutional:      General: She is not in acute distress.    Appearance: Normal appearance.  HENT:     Head: Normocephalic and atraumatic.  Eyes:     General:        Right eye: No discharge.        Left eye: No discharge.  Cardiovascular:     Rate and Rhythm: Normal rate and regular rhythm.  Pulmonary:     Effort: Pulmonary effort is normal. No respiratory distress.  Musculoskeletal:        General: No deformity.     Comments: No midline spinal tenderness, some left lumbar paraspinous muscle tenderness.  Left leg positive straight leg raise.  Intact strength 5 out of 5 bilateral lower extremities to flexion, extension, dorsiflexion, plantar flexion.  No sensory deficit per patient.  Skin:    General: Skin is warm and dry.  Neurological:     Mental Status: She is alert and oriented to person, place, and time.  Psychiatric:        Mood and Affect: Mood normal.        Behavior: Behavior normal.    ED Results / Procedures / Treatments   Labs (all labs ordered are listed, but only abnormal results are displayed) Labs Reviewed - No data to display  EKG None  Radiology No results found.  Procedures Procedures   Medications Ordered in ED Medications  ibuprofen (ADVIL) tablet 800 mg (800 mg Oral Given 05/31/21 1226)    ED Course  I have reviewed the triage vital signs and the nursing notes.  Pertinent labs & imaging results that were available  during my care of the patient were reviewed by me and considered in my medical decision making (see chart for details).    MDM Rules/Calculators/A&P                         Patient with history of sciatica presents with left lower back pain suspicious for sciatica.  Patient with positive straight leg raise, no midline spinal tenderness, left paraspinous muscle tenderness in the lumbar  region.  Patient with neurovascularly intact left legs.  No red flags for back pain including chronic corticosteroid use, history of cancer, IV drug use, fever, saddle anesthesia, difficulty with urination, defecation.  Patient does not tolerate steroids, so we will provide relief with anti-inflammatories, lidocaine, and muscle relaxant.  Patient has an established orthopedic doctor, encouraged follow-up.  Patient discharged in stable condition, return precautions given. Final Clinical Impression(s) / ED Diagnoses Final diagnoses:  Acute left-sided low back pain with left-sided sciatica    Rx / DC Orders ED Discharge Orders          Ordered    ketorolac (TORADOL) 10 MG tablet  Every 6 hours PRN        05/31/21 1208    lidocaine (LIDODERM) 5 %  Every 24 hours        05/31/21 1208    methocarbamol (ROBAXIN) 500 MG tablet  2 times daily        05/31/21 1208             Anselmo Pickler, PA-C 05/31/21 1315    Gareth Morgan, MD 06/01/21 0715

## 2021-05-31 NOTE — Discharge Instructions (Addendum)
Please use Tylenol or ibuprofen for pain.  You may use 10-20 mg toradol every 6 hours or 1000 mg of Tylenol every 6 hours.  You may choose to alternate between the 2.  This would be most effective.  Not to exceed 4 g of Tylenol within 24 hours.  Not to exceed 80mg  toradol in 24hrs.  In addition to the above you may use the lidocaine patches and muscle relaxant. The muscle relaxant may make you sleepy, be cautious using it before piloting a vehicle or operating heavy machinery.  Please follow up with your PCP.

## 2021-06-25 ENCOUNTER — Ambulatory Visit: Payer: BC Managed Care – PPO | Admitting: Podiatry

## 2021-10-17 ENCOUNTER — Other Ambulatory Visit: Payer: Self-pay | Admitting: Family Medicine

## 2021-10-17 DIAGNOSIS — Z1231 Encounter for screening mammogram for malignant neoplasm of breast: Secondary | ICD-10-CM

## 2021-11-26 DIAGNOSIS — M79642 Pain in left hand: Secondary | ICD-10-CM | POA: Insufficient documentation

## 2021-11-27 DIAGNOSIS — M65332 Trigger finger, left middle finger: Secondary | ICD-10-CM | POA: Insufficient documentation

## 2021-11-27 DIAGNOSIS — M65331 Trigger finger, right middle finger: Secondary | ICD-10-CM | POA: Insufficient documentation

## 2021-11-27 DIAGNOSIS — G5602 Carpal tunnel syndrome, left upper limb: Secondary | ICD-10-CM | POA: Insufficient documentation

## 2022-01-29 DIAGNOSIS — M545 Low back pain, unspecified: Secondary | ICD-10-CM | POA: Insufficient documentation

## 2022-02-23 IMAGING — US US PELVIS COMPLETE
1 series · 14 of 25 positions shown · non-contrast
Comparison: CT of same day.

CLINICAL DATA: Possible hydrosalpinx.

EXAM:
TRANSABDOMINAL ULTRASOUND OF PELVIS
TECHNIQUE: Transabdominal ultrasound examination of the pelvis was performed
including evaluation of the uterus, ovaries, adnexal regions, and
pelvic cul-de-sac.

[Series 1: us pelvis complete · 80 acquisitions, 14 frames shown]
[im 1/80]
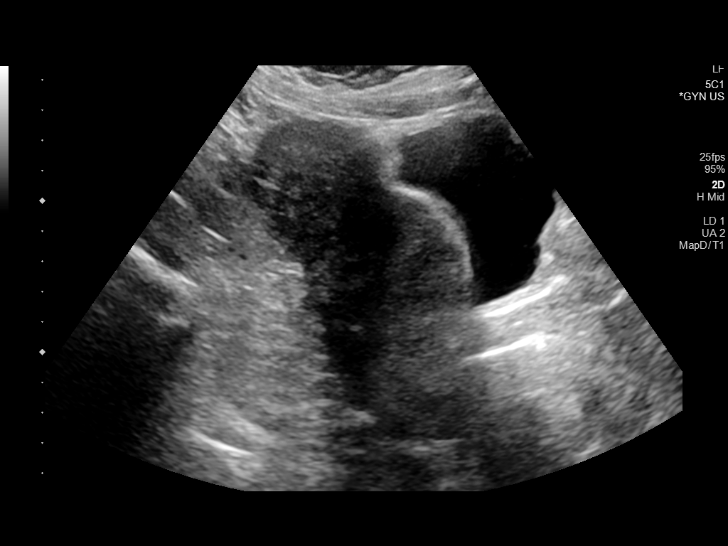
[im 7/80]
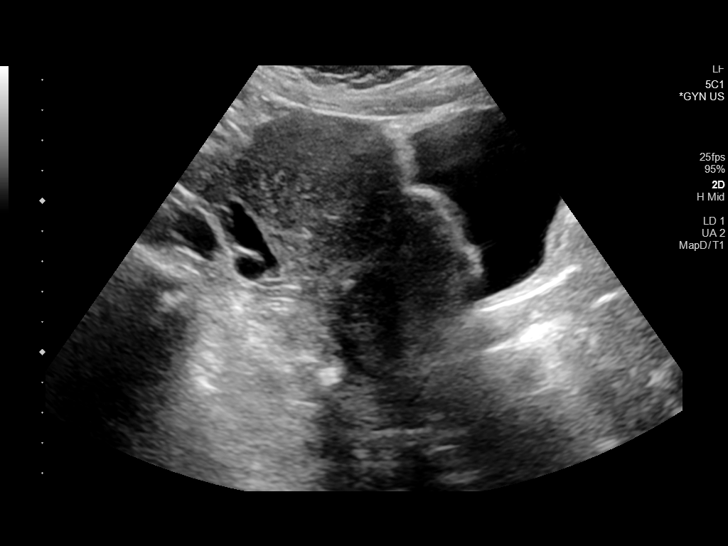
[im 14/80]
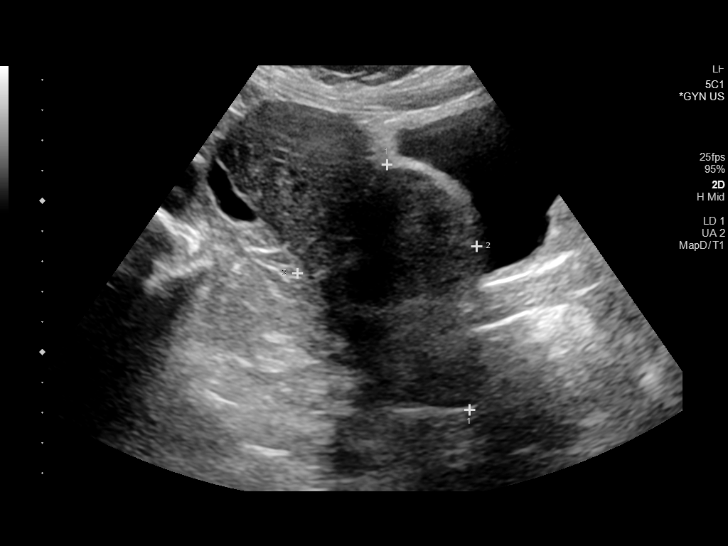
[im 20/80]
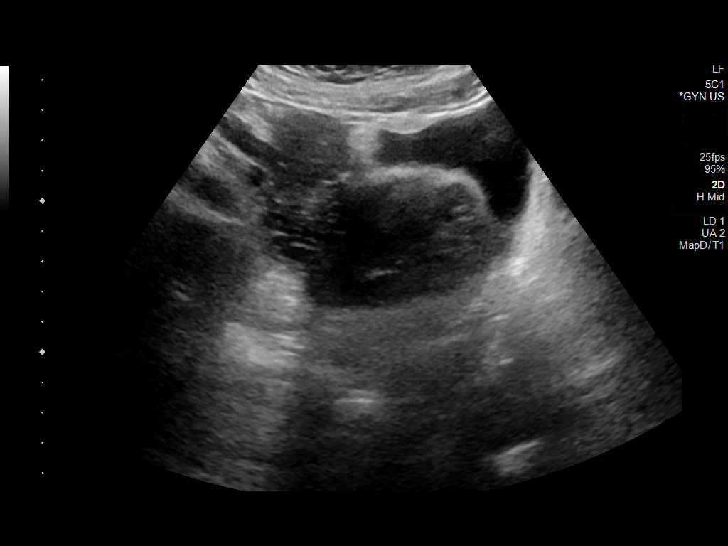
[im 27/80]
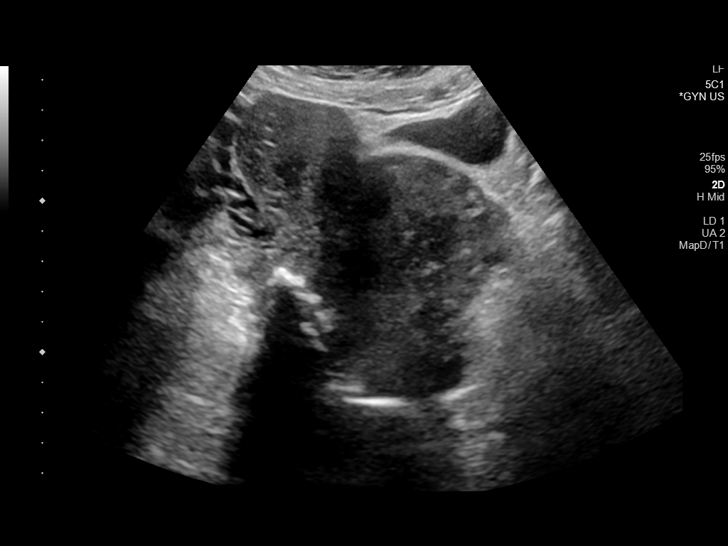
[im 30/80]
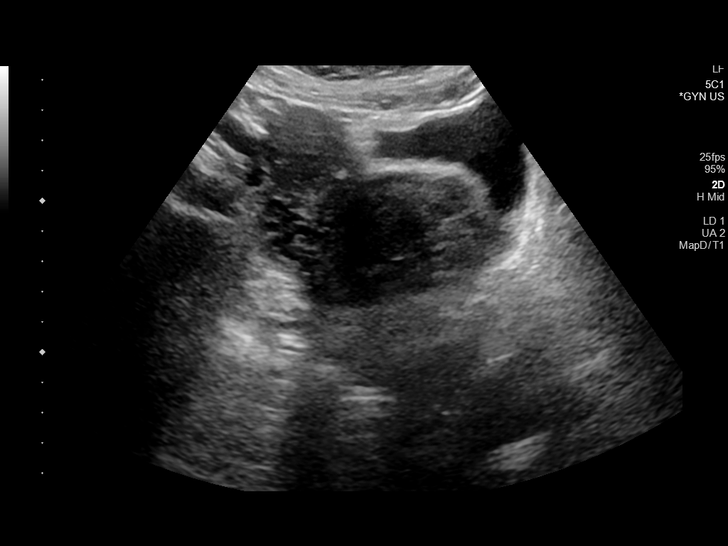
[im 37/80]
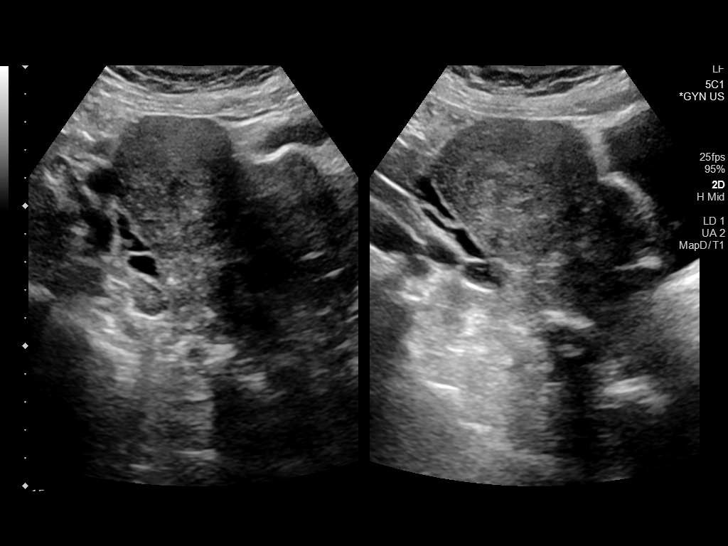
[im 43/80]
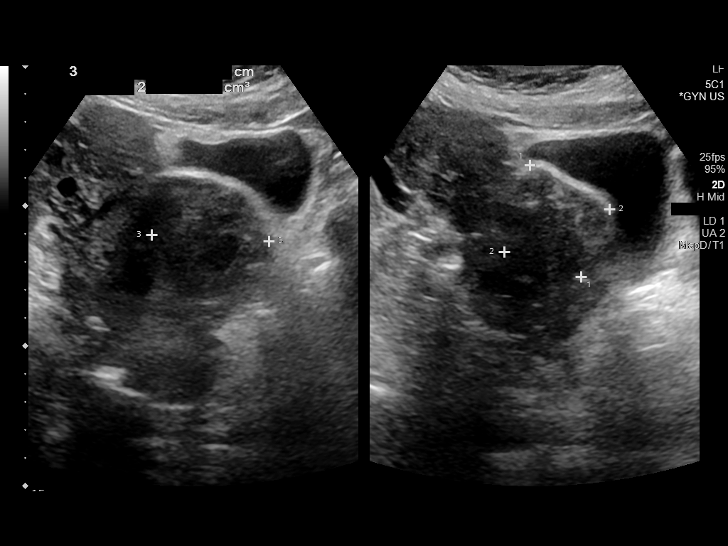
[im 50/80]
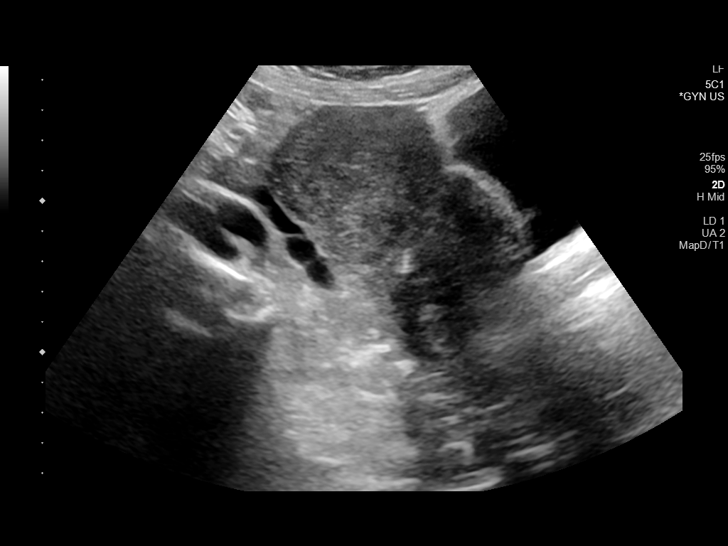
[im 53/80]
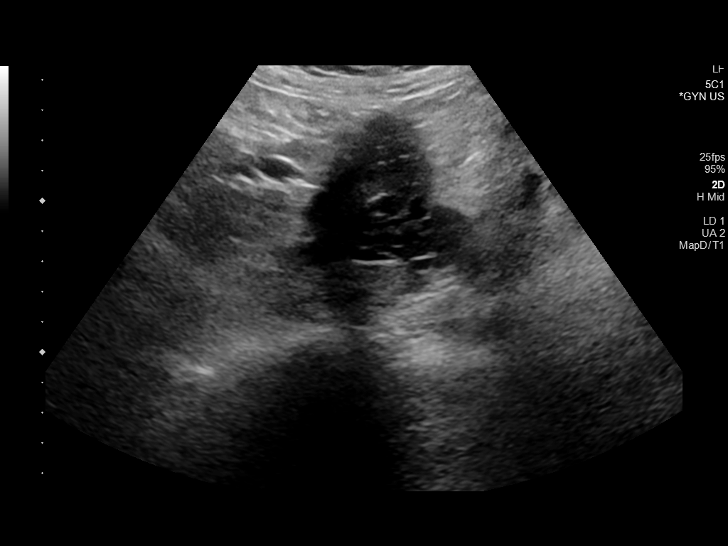
[im 60/80]
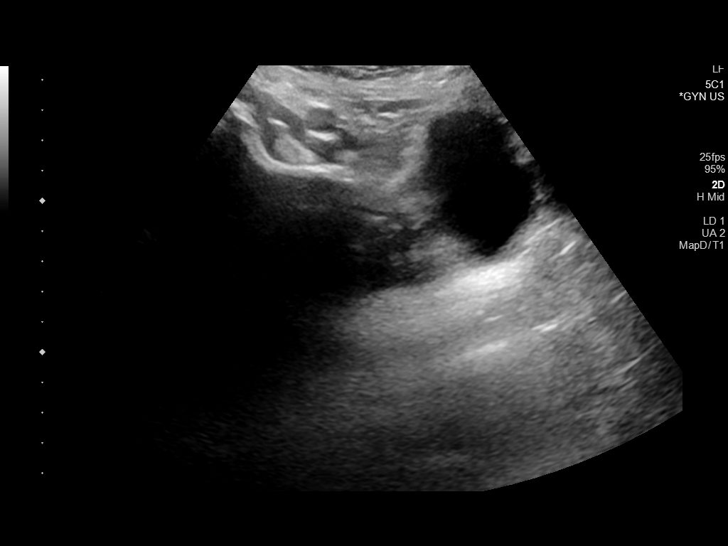
[im 66/80]
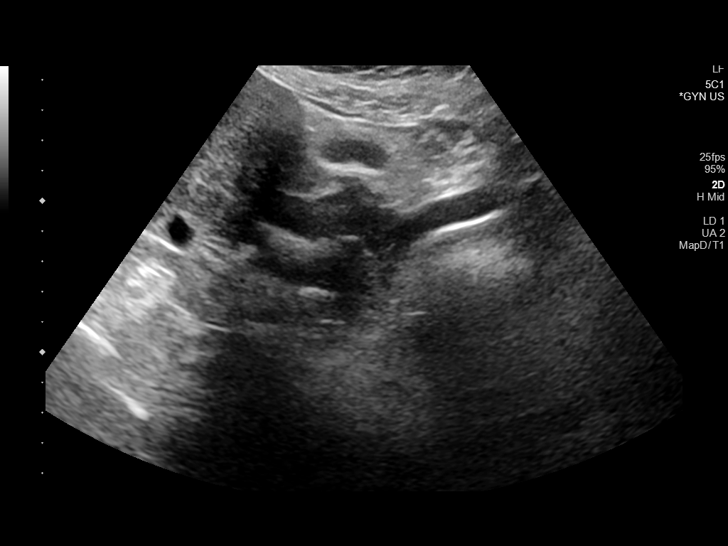
[im 73/80]
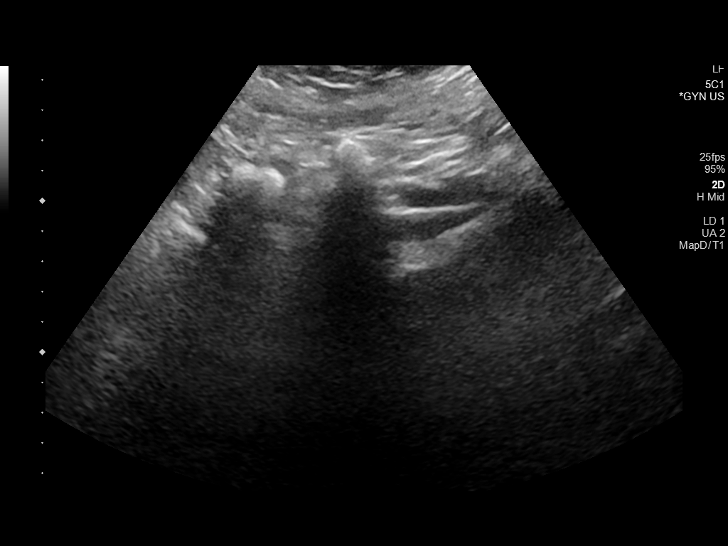
[im 80/80]
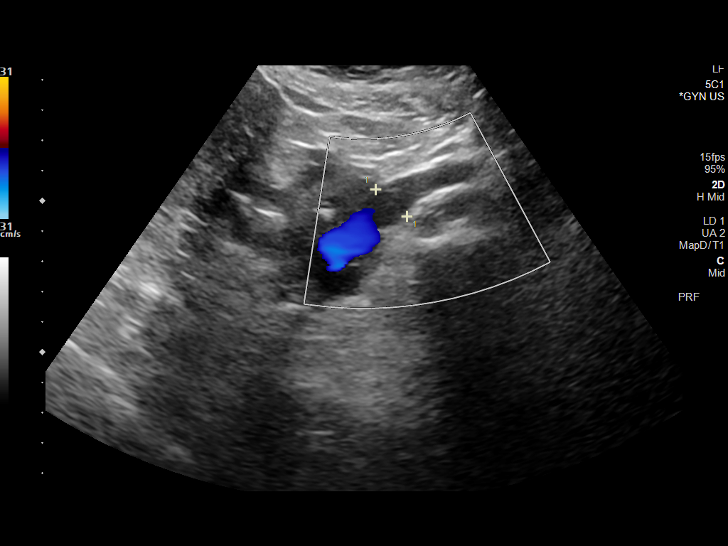

[14 of 25 positions shown; findings below may reference images not displayed]

FINDINGS: Uterus

Measurements: 8.6 x 6.9 x 6.0 cm = volume: 185 mL. Multiple fibroids
are noted, with the largest measuring 6 cm involving the uterine
fundus. Left-sided fibroid measuring 4.4 cm is noted.

Endometrium

Thickness: 6 mm which is within normal limits. No focal abnormality
visualized.

Right ovary

Measurements: 3.7 x 2.8 x 2.8 cm = volume: 15 mL. Serpiginous
tubular structure is noted in the right adnexal region concerning
for hydrosalpinx.

Left ovary

Measurements: 3.6 x 2.8 x 3.3 cm = volume: 17 mL. Tubular structure
is also noted in the left adnexal region which may represent
hydrosalpinx.

Other findings:  No abnormal free fluid.
IMPRESSION: Multiple uterine fibroids are noted, the largest measuring 6 cm.

Probable bilateral hydrosalpinges are noted.

## 2022-02-26 DIAGNOSIS — M4316 Spondylolisthesis, lumbar region: Secondary | ICD-10-CM | POA: Insufficient documentation

## 2022-04-28 DIAGNOSIS — M25561 Pain in right knee: Secondary | ICD-10-CM | POA: Insufficient documentation

## 2022-05-13 ENCOUNTER — Ambulatory Visit (HOSPITAL_BASED_OUTPATIENT_CLINIC_OR_DEPARTMENT_OTHER): Payer: BC Managed Care – PPO | Admitting: Cardiovascular Disease

## 2022-06-04 ENCOUNTER — Other Ambulatory Visit: Payer: Self-pay

## 2022-06-04 ENCOUNTER — Emergency Department (HOSPITAL_COMMUNITY)
Admission: EM | Admit: 2022-06-04 | Discharge: 2022-06-04 | Disposition: A | Discharge status: Home / Self Care | Payer: BC Managed Care – PPO | Attending: Emergency Medicine | Admitting: Emergency Medicine

## 2022-06-04 ENCOUNTER — Encounter (HOSPITAL_COMMUNITY): Payer: Self-pay

## 2022-06-04 DIAGNOSIS — Z79899 Other long term (current) drug therapy: Secondary | ICD-10-CM | POA: Diagnosis not present

## 2022-06-04 DIAGNOSIS — R1011 Right upper quadrant pain: Secondary | ICD-10-CM | POA: Diagnosis not present

## 2022-06-04 DIAGNOSIS — J45909 Unspecified asthma, uncomplicated: Secondary | ICD-10-CM | POA: Diagnosis not present

## 2022-06-04 DIAGNOSIS — R1010 Upper abdominal pain, unspecified: Secondary | ICD-10-CM

## 2022-06-04 DIAGNOSIS — R21 Rash and other nonspecific skin eruption: Secondary | ICD-10-CM | POA: Insufficient documentation

## 2022-06-04 DIAGNOSIS — R1013 Epigastric pain: Secondary | ICD-10-CM | POA: Diagnosis not present

## 2022-06-04 DIAGNOSIS — R1012 Left upper quadrant pain: Secondary | ICD-10-CM | POA: Insufficient documentation

## 2022-06-04 DIAGNOSIS — Z91040 Latex allergy status: Secondary | ICD-10-CM | POA: Insufficient documentation

## 2022-06-04 DIAGNOSIS — L304 Erythema intertrigo: Secondary | ICD-10-CM

## 2022-06-04 DIAGNOSIS — I10 Essential (primary) hypertension: Secondary | ICD-10-CM | POA: Insufficient documentation

## 2022-06-04 LAB — CBC WITH DIFFERENTIAL/PLATELET
Abs Immature Granulocytes: 0.01 10*3/uL (ref 0.00–0.07)
Basophils Absolute: 0 10*3/uL (ref 0.0–0.1)
Basophils Relative: 1 %
Eosinophils Absolute: 0.1 10*3/uL (ref 0.0–0.5)
Eosinophils Relative: 2 %
HCT: 40.9 % (ref 36.0–46.0)
Hemoglobin: 13.3 g/dL (ref 12.0–15.0)
Immature Granulocytes: 0 %
Lymphocytes Relative: 25 %
Lymphs Abs: 1.3 10*3/uL (ref 0.7–4.0)
MCH: 30.6 pg (ref 26.0–34.0)
MCHC: 32.5 g/dL (ref 30.0–36.0)
MCV: 94.2 fL (ref 80.0–100.0)
Monocytes Absolute: 0.2 10*3/uL (ref 0.1–1.0)
Monocytes Relative: 5 %
Neutro Abs: 3.5 10*3/uL (ref 1.7–7.7)
Neutrophils Relative %: 67 %
Platelets: 225 10*3/uL (ref 150–400)
RBC: 4.34 MIL/uL (ref 3.87–5.11)
RDW: 12.8 % (ref 11.5–15.5)
WBC: 5.2 10*3/uL (ref 4.0–10.5)
nRBC: 0 % (ref 0.0–0.2)

## 2022-06-04 LAB — COMPREHENSIVE METABOLIC PANEL
ALT: 12 U/L (ref 0–44)
AST: 17 U/L (ref 15–41)
Albumin: 4 g/dL (ref 3.5–5.0)
Alkaline Phosphatase: 54 U/L (ref 38–126)
Anion gap: 3 — ABNORMAL LOW (ref 5–15)
BUN: 15 mg/dL (ref 8–23)
CO2: 27 mmol/L (ref 22–32)
Calcium: 9.2 mg/dL (ref 8.9–10.3)
Chloride: 112 mmol/L — ABNORMAL HIGH (ref 98–111)
Creatinine, Ser: 0.77 mg/dL (ref 0.44–1.00)
GFR, Estimated: >60 mL/min (ref >60)
Glucose, Bld: 107 mg/dL — ABNORMAL HIGH (ref 70–99)
Potassium: 3.9 mmol/L (ref 3.5–5.1)
Sodium: 142 mmol/L (ref 135–145)
Total Bilirubin: 0.8 mg/dL (ref 0.3–1.2)
Total Protein: 6.9 g/dL (ref 6.5–8.1)

## 2022-06-04 LAB — URINALYSIS, ROUTINE W REFLEX MICROSCOPIC
Bilirubin Urine: NEGATIVE
Glucose, UA: NEGATIVE mg/dL
Hgb urine dipstick: NEGATIVE
Ketones, ur: NEGATIVE mg/dL
Leukocytes,Ua: NEGATIVE
Nitrite: NEGATIVE
Protein, ur: NEGATIVE mg/dL
Specific Gravity, Urine: 1.019 (ref 1.005–1.030)
pH: 6 (ref 5.0–8.0)

## 2022-06-04 LAB — LIPASE, BLOOD: Lipase: 36 U/L (ref 11–51)

## 2022-06-04 MED ORDER — NYSTATIN 100000 UNIT/GM EX POWD
Freq: Once | CUTANEOUS | Status: AC
  Filled 2022-06-04: qty 15

## 2022-06-04 MED ORDER — AMLODIPINE BESYLATE 5 MG PO TABS
5.0000 mg | ORAL_TABLET | Freq: Once | ORAL | Status: DC
  Filled 2022-06-04: qty 1

## 2022-06-04 MED ORDER — NYSTATIN 100000 UNIT/GM EX POWD: 1.0000 | Freq: Three times a day (TID) | CUTANEOUS | 0 refills | Status: DC

## 2022-06-04 NOTE — ED Provider Notes (Signed)
[facility] DEPT Provider Note   CSN: [id number] Arrival date & time: 06/04/22  9449     History  Chief Complaint  Patient presents with   Abdominal Pain    Brenda Peters is a 66 y.o. female.   Abdominal Pain Patient presents for abdominal pain.  Medical history includes asthma, HTN, HLD.  Surgical history includes tubal ligation.  For the past 2 days, she has experienced a upper abdominal pain.  Pain is located across her upper abdomen.  It has been persistent.  She has not had associated nausea or vomiting.  She has continued to have regular, daily bowel movements.  In addition to her upper abdominal pain, she has also noticed a erythema in her umbilicus.     Home Medications Prior to Admission medications   Medication Sig Start Date End Date Taking? Authorizing Provider  nystatin (MYCOSTATIN/NYSTOP) powder Apply 1 Application topically 3 (three) times daily. Clean and dry belly button area and apply powder 3x per day until rash is gone. 06/04/22  Yes Godfrey Pick, MD  acetaminophen (TYLENOL) 500 MG tablet Take 1,000 mg by mouth every 6 (six) hours as needed for mild pain.    [provider]  amLODipine (NORVASC) 2.5 MG tablet Take by mouth. 05/24/20   [provider]  azelastine (ASTELIN) 0.1 % nasal spray SMARTSIG:1-2 Spray(s) Both Nares 1-2 Times Daily 05/13/21   [provider]  betamethasone dipropionate 0.05 % cream Apply 1 application topically 2 (two) times daily. 12/04/20   [provider]  cetirizine (ZYRTEC) 10 MG tablet Take 1 tablet (10 mg total) by mouth daily. 12/29/19   Jaynee Eagles, PA-C  dicyclomine (BENTYL) 20 MG tablet Take 1 tablet (20 mg total) by mouth 2 (two) times daily. 11/03/20   Garald Balding, PA-C  ergocalciferol (VITAMIN D2) 1.25 MG (50000 UT) capsule Take 1 capsule by mouth once a week. 09/22/13   [provider]  hydrochlorothiazide (HYDRODIURIL) 12.5 MG tablet Take 12.5 mg  by mouth every morning. 08/04/20   [provider]  ibuprofen (ADVIL) 600 MG tablet Take 1,200 mg by mouth 2 (two) times daily as needed. 08/11/20   [provider]  ketorolac (TORADOL) 10 MG tablet Take 1 tablet (10 mg total) by mouth every 6 (six) hours as needed. 05/31/21   Prosperi, Christian H, PA-C  lidocaine (LIDODERM) 5 % Place 1 patch onto the skin daily. Remove & Discard patch within 12 hours or as directed by MD 05/31/21   Prosperi, Darrick Meigs H, PA-C  meloxicam (MOBIC) 15 MG tablet Take 1 tablet (15 mg total) by mouth daily. 05/14/21   Lorenda Peck, DPM  methocarbamol (ROBAXIN) 500 MG tablet Take 1 tablet (500 mg total) by mouth 2 (two) times daily. 05/31/21   Prosperi, Christian H, PA-C  ondansetron (ZOFRAN ODT) 4 MG disintegrating tablet Take 1 tablet (4 mg total) by mouth every 8 (eight) hours as needed for nausea or vomiting. 11/03/20   Garald Balding, PA-C  traMADol (ULTRAM) 50 MG tablet Take 50 mg by mouth every 8 (eight) hours as needed. 09/13/20   [provider]      Allergies    Codeine, Morphine and related, Latex, Lisinopril, Lovastatin, and Prednisone    Review of Systems   Review of Systems  Gastrointestinal:  Positive for abdominal pain.  Skin:  Positive for rash.  All other systems reviewed and are negative.   Physical Exam Updated Vital Signs BP (!) 181/93 (BP Location: Left  Arm)   Pulse 87   Temp 98.4 F (36.9 C) (Oral)   Resp 18   Ht 5' (1.524 m)   Wt 69 kg   LMP 12/29/2012   SpO2 95%   BMI 29.71 kg/m  Physical Exam Vitals and nursing note reviewed.  Constitutional:      General: She is not in acute distress.    Appearance: She is well-developed. She is not ill-appearing, toxic-appearing or diaphoretic.  HENT:     Head: Normocephalic and atraumatic.  Eyes:     General: No scleral icterus.    Extraocular Movements: Extraocular movements intact.     Conjunctiva/sclera: Conjunctivae normal.  Cardiovascular:     Rate and  Rhythm: Normal rate and regular rhythm.  Pulmonary:     Effort: Pulmonary effort is normal. No respiratory distress.  Abdominal:     Palpations: Abdomen is soft.     Tenderness: There is abdominal tenderness in the right upper quadrant, epigastric area and left upper quadrant. There is no guarding or rebound.  Musculoskeletal:        General: No swelling.     Cervical back: Neck supple.  Skin:    General: Skin is warm and dry.     Coloration: Skin is not cyanotic, jaundiced or pale.     Findings: Rash (Intertrigo in umbilicus) present.  Neurological:     General: No focal deficit present.     Mental Status: She is alert and oriented to person, place, and time.     Cranial Nerves: No cranial nerve deficit.     Motor: No weakness.  Psychiatric:        Mood and Affect: Mood normal.        Behavior: Behavior normal.     ED Results / Procedures / Treatments   Labs (all labs ordered are listed, but only abnormal results are displayed) Labs Reviewed  COMPREHENSIVE METABOLIC PANEL - Abnormal; Notable for the following components:      Result Value   Chloride 112 (*)    Glucose, Bld 107 (*)    Anion gap 3 (*)    All other components within normal limits  CBC WITH DIFFERENTIAL/PLATELET  LIPASE, BLOOD  URINALYSIS, ROUTINE W REFLEX MICROSCOPIC    EKG None  Radiology No results found.  Procedures Procedures    Medications Ordered in ED Medications  amLODipine (NORVASC) tablet 5 mg (has no administration in time range)  nystatin (MYCOSTATIN/NYSTOP) topical powder ( Topical Given 06/04/22 1236)    ED Course/ Medical Decision Making/ A&P                           Medical Decision Making Risk Prescription drug management.   This patient presents to the ED for concern of abdominal pain, this involves an extensive number of treatment options, and is a complaint that carries with it a high risk of complications and morbidity.  The differential diagnosis includes  constipation, colitis, neoplasm, SBO, enteritis, GERD, PUD   Co morbidities that complicate the patient evaluation  asthma, HTN, HLD   Additional history obtained:  Additional history obtained from N/A External records from outside source obtained and reviewed including EMR   Lab Tests:  I Ordered, and personally interpreted labs.  The pertinent results include: Normal creatinine, normal hepatobiliary enzymes, no leukocytosis, normal lipase, no evidence of UTI   Imaging Studies ordered:  I ordered imaging studies including CT of abdomen and pelvis I independently visualized and  interpreted imaging which showed (patient declined) I agree with the radiologist interpretation   Cardiac Monitoring: / EKG:  The patient was maintained on a cardiac monitor.  I personally viewed and interpreted the cardiac monitored which showed an underlying rhythm of: Sinus rhythm  Problem List / ED Course / Critical interventions / Medication management  Patient presents for abdominal pain and concern of redness around her umbilicus.  Initial vital signs are notable for hypertension.  Prior to being bedded in the ED, diagnostic work-up was initiated.  Lab work shows normal kidney function, normal electrolytes, normal hemoglobin, no leukocytosis, normal lipase, and unremarkable UA.  On assessment, patient is well-appearing.  The area of concern in her umbilicus appears to be an intertriginous rash.  There is no discharge.  Nystatin powder was ordered.  Patient was advised to continue this until rash resolves.  No abdominal distention is noted on exam.  She does have tenderness throughout her upper abdomen.  Given her abdominal pain and tenderness, patient to undergo CT imaging.  Home dose of amlodipine was ordered for her hypertension.  Shortly after discussion regarding workup, patient stated that she does not want CT scan.  She describes claustrophobia is the reason.  She declined any anxiolytic  medications.  She did state that she would get her dose of amlodipine but left AMA prior to this being given.  Nystatin powder was prescribed. I ordered medication including nystatin for intertrigo Reevaluation of the patient after these medicines showed that the patient stayed the same I have reviewed the patients home medicines and have made adjustments as needed   Social Determinants of Health:  Has access to outpatient care          Final Clinical Impression(s) / ED Diagnoses Final diagnoses:  Pain of upper abdomen  Intertrigo    Rx / DC Orders ED Discharge Orders          Ordered    nystatin (MYCOSTATIN/NYSTOP) powder  3 times daily        06/04/22 1328              Godfrey Pick, MD 06/04/22 1330

## 2022-06-04 NOTE — ED Triage Notes (Signed)
C/o generalized abd pain x1 day. Pt reports redness to belly button with heat.  Denies urinary s/sy Denies n/v/d

## 2022-06-04 NOTE — ED Notes (Signed)
Patient refused Norvasc and CT MD made aware. Nurse instructed to give patient nystatin powder and D/C paperwork. Patient left A&O, stable and with no questions or concerns.

## 2022-06-04 NOTE — ED Provider Triage Note (Cosign Needed)
Emergency Medicine Provider Triage Evaluation Note  Brenda Peters , a 66 y.o. female  was evaluated in triage.  Pt complains of abdominal pain  and rash and redness around umbilicus   Review of Systems  Positive: Aching upper abdominal pain  Negative: fever  Physical Exam  BP (!) 181/93 (BP Location: Left Arm)   Pulse 87   Temp 98.4 F (36.9 C) (Oral)   Resp 18   Ht 5' (1.524 m)   Wt 69 kg   LMP 12/29/2012   SpO2 95%   BMI 29.71 kg/m  Gen:   Awake, no distress   Resp:  Normal effort  MSK:   Moves extremities without difficulty  Other:   3cm area of erythema umbilicus Medical Decision Making  Medically screening exam initiated at 9:51 AM.  Appropriate orders placed.  MARTASIA TALAMANTE was informed that the remainder of the evaluation will be completed by another provider, this initial triage assessment does not replace that evaluation, and the importance of remaining in the ED until their evaluation is complete.     Fransico Meadow, [place] 06/04/22 [phone]

## 2022-06-14 ENCOUNTER — Ambulatory Visit
Admission: EM | Admit: 2022-06-14 | Discharge: 2022-06-14 | Disposition: A | Discharge status: Home / Self Care | Payer: BC Managed Care – PPO

## 2022-06-14 DIAGNOSIS — J069 Acute upper respiratory infection, unspecified: Secondary | ICD-10-CM

## 2022-06-14 MED ORDER — CETIRIZINE HCL 10 MG PO TABS: 10.0000 mg | ORAL_TABLET | Freq: Every day | ORAL | 0 refills | Status: DC

## 2022-06-14 MED ORDER — BENZONATATE 100 MG PO CAPS: 100.0000 mg | ORAL_CAPSULE | Freq: Three times a day (TID) | ORAL | 0 refills | Status: DC | PRN

## 2022-06-14 NOTE — Discharge Instructions (Signed)
It appears that you have a viral upper respiratory infection which should run its course and self resolve with symptomatic treatment.  I have prescribed you a few medications to help alleviate symptoms.  I am not going to prescribe Flonase despite what we talked about given that it appears that you have an adverse reaction in your chart. The other medications will be just as helpful.

## 2022-06-14 NOTE — ED Provider Notes (Signed)
[facility]    CSN: [id number] Arrival date & time: 06/14/22  1044      History   Chief Complaint Chief Complaint  Patient presents with   Otalgia    HPI MILESSA HOGAN is a 66 y.o. female.   Patient presents with nasal congestion, cough, ear pain, sore throat that started about 6 days ago.  She denies any known sick contacts or fevers at home.  Denies chest pain, shortness of breath, nausea, vomiting, diarrhea, abdominal pain.  Patient has taken Coricidin HBP with minimal improvement in symptoms.  Patient denies history of COPD but reports that she has asthma when she was a lot younger.  Patient took COVID test at home that was negative.   Otalgia   Past Medical History:  Diagnosis Date   Asthma    Carpal tunnel syndrome    Heart murmur    Heart palpitations    High cholesterol    Hypertension     Patient Active Problem List   Diagnosis Date Noted   Cervical spondylosis without myelopathy 09/09/2019   Ankle pain, chronic 09/21/2013   Dry eye syndrome 06/02/2013   Family history of premature CAD 07/26/2012   Carpal tunnel syndrome on right 01/21/2011   HTN (hypertension) 01/21/2011    Past Surgical History:  Procedure Laterality Date   TUBAL LIGATION      OB History   No obstetric history on file.      Home Medications    Prior to Admission medications   Medication Sig Start Date End Date Taking? Authorizing Provider  benzonatate (TESSALON) 100 MG capsule Take 1 capsule (100 mg total) by mouth every 8 (eight) hours as needed for cough. 06/14/22  Yes Kaydee Magel, Michele Rockers, FNP  cetirizine (ZYRTEC) 10 MG tablet Take 1 tablet (10 mg total) by mouth daily for 10 days. 06/14/22 06/24/22 Yes Alecxis Baltzell, Michele Rockers, FNP  omeprazole (PRILOSEC) 20 MG capsule Take 20 mg by mouth daily.   Yes [provider]  acetaminophen (TYLENOL) 500 MG tablet Take 1,000 mg by mouth every 6 (six) hours as needed for mild pain.    [provider]  amLODipine  (NORVASC) 2.5 MG tablet Take by mouth. 05/24/20   [provider]  azelastine (ASTELIN) 0.1 % nasal spray SMARTSIG:1-2 Spray(s) Both Nares 1-2 Times Daily 05/13/21   [provider]  betamethasone dipropionate 0.05 % cream Apply 1 application topically 2 (two) times daily. 12/04/20   [provider]  dicyclomine (BENTYL) 20 MG tablet Take 1 tablet (20 mg total) by mouth 2 (two) times daily. 11/03/20   Garald Balding, PA-C  ergocalciferol (VITAMIN D2) 1.25 MG (50000 UT) capsule Take 1 capsule by mouth once a week. 09/22/13   [provider]  hydrochlorothiazide (HYDRODIURIL) 12.5 MG tablet Take 12.5 mg by mouth every morning. 08/04/20   [provider]  ibuprofen (ADVIL) 600 MG tablet Take 1,200 mg by mouth 2 (two) times daily as needed. 08/11/20   [provider]  ketorolac (TORADOL) 10 MG tablet Take 1 tablet (10 mg total) by mouth every 6 (six) hours as needed. 05/31/21   Prosperi, Christian H, PA-C  lidocaine (LIDODERM) 5 % Place 1 patch onto the skin daily. Remove & Discard patch within 12 hours or as directed by MD 05/31/21   Prosperi, Darrick Meigs H, PA-C  meloxicam (MOBIC) 15 MG tablet Take 1 tablet (15 mg total) by mouth daily. 05/14/21   Lorenda Peck, DPM  methocarbamol (ROBAXIN) 500 MG tablet  Take 1 tablet (500 mg total) by mouth 2 (two) times daily. 05/31/21   Prosperi, Christian H, PA-C  nystatin (MYCOSTATIN/NYSTOP) powder Apply 1 Application topically 3 (three) times daily. Clean and dry belly button area and apply powder 3x per day until rash is gone. 06/04/22   Godfrey Pick, MD  ondansetron (ZOFRAN ODT) 4 MG disintegrating tablet Take 1 tablet (4 mg total) by mouth every 8 (eight) hours as needed for nausea or vomiting. 11/03/20   Garald Balding, PA-C  traMADol (ULTRAM) 50 MG tablet Take 50 mg by mouth every 8 (eight) hours as needed. 09/13/20   [provider]    Family History Family History  Problem Relation Age of Onset    Diabetes Mother    Hypertension Mother     Social History Social History   Tobacco Use   Smoking status: Never   Smokeless tobacco: Never  Vaping Use   Vaping Use: Never used  Substance Use Topics   Alcohol use: No   Drug use: No     Allergies   Codeine, Morphine and related, Latex, Lisinopril, Lovastatin, and Prednisone   Review of Systems Review of Systems Per HPI  Physical Exam Triage Vital Signs ED Triage Vitals [06/14/22 1121]  Enc Vitals Group     BP (!) 161/88     Pulse Rate 89     Resp 16     Temp 98.2 F (36.8 C)     Temp Source Oral     SpO2 96 %     Weight      Height      Head Circumference      Peak Flow      Pain Score 4     Pain Loc      Pain Edu?      Excl. in [place]?    No data found.  Updated Vital Signs BP (!) 161/88 (BP Location: Left Arm)   Pulse 89   Temp 98.2 F (36.8 C) (Oral)   Resp 16   LMP 12/29/2012   SpO2 96%   Visual Acuity Right Eye Distance:   Left Eye Distance:   Bilateral Distance:    Right Eye Near:   Left Eye Near:    Bilateral Near:     Physical Exam Constitutional:      General: She is not in acute distress.    Appearance: Normal appearance. She is not toxic-appearing or diaphoretic.  HENT:     Head: Normocephalic and atraumatic.     Right Ear: Ear canal normal. No drainage, swelling or tenderness. A middle ear effusion is present. Tympanic membrane is not perforated, erythematous or bulging.     Left Ear: Ear canal normal. No drainage, swelling or tenderness. A middle ear effusion is present. Tympanic membrane is not perforated, erythematous or bulging.     Nose: Congestion present.     Mouth/Throat:     Mouth: Mucous membranes are moist.     Pharynx: No posterior oropharyngeal erythema.  Eyes:     Extraocular Movements: Extraocular movements intact.     Conjunctiva/sclera: Conjunctivae normal.     Pupils: Pupils are equal, round, and reactive to light.  Cardiovascular:     Rate and Rhythm: Normal  rate and regular rhythm.     Pulses: Normal pulses.     Heart sounds: Normal heart sounds.  Pulmonary:     Effort: Pulmonary effort is normal. No respiratory distress.     Breath sounds: Normal breath sounds.  No stridor. No wheezing, rhonchi or rales.  Abdominal:     General: Abdomen is flat. Bowel sounds are normal.     Palpations: Abdomen is soft.  Musculoskeletal:        General: Normal range of motion.     Cervical back: Normal range of motion.  Skin:    General: Skin is warm and dry.  Neurological:     General: No focal deficit present.     Mental Status: She is alert and oriented to person, place, and time. Mental status is at baseline.  Psychiatric:        Mood and Affect: Mood normal.        Behavior: Behavior normal.      UC Treatments / Results  Labs (all labs ordered are listed, but only abnormal results are displayed) Labs Reviewed - No data to display  EKG   Radiology No results found.  Procedures Procedures (including critical care time)  Medications Ordered in UC Medications - No data to display  Initial Impression / Assessment and Plan / UC Course  I have reviewed the triage vital signs and the nursing notes.  Pertinent labs & imaging results that were available during my care of the patient were reviewed by me and considered in my medical decision making (see chart for details).     Patient presents with symptoms likely from a viral upper respiratory infection. Differential includes bacterial pneumonia, sinusitis, allergic rhinitis, COVID-19, flu, RSV. Do not suspect underlying cardiopulmonary process. Symptoms seem unlikely related to ACS, CHF or COPD exacerbations, pneumonia, pneumothorax. Patient is nontoxic appearing and not in need of emergent medical intervention.  Patient declined viral testing.  Recommended symptom control with over the counter medications that are safe for patient.  Patient sent prescriptions as she states that she does not  taking daily antihistamine so this should be safe.  Patient has elevated blood pressure reading that is mild.  Advised patient to monitor this very closely at home and follow up if it remains elevated.  No concern for hypertensive urgency at this time.  Return if symptoms fail to improve in 1-2 weeks or you develop shortness of breath, chest pain, severe headache. Patient states understanding and is agreeable.  Discharged with PCP followup.  Final Clinical Impressions(s) / UC Diagnoses   Final diagnoses:  Viral upper respiratory tract infection with cough     Discharge Instructions      It appears that you have a viral upper respiratory infection which should run its course and self resolve with symptomatic treatment.  I have prescribed you a few medications to help alleviate symptoms.  I am not going to prescribe Flonase despite what we talked about given that it appears that you have an adverse reaction in your chart. The other medications will be just as helpful.     ED Prescriptions     Medication Sig Dispense Auth. Provider   cetirizine (ZYRTEC) 10 MG tablet Take 1 tablet (10 mg total) by mouth daily for 10 days. 10 tablet [place], [place] E, [place]   benzonatate (TESSALON) 100 MG capsule Take 1 capsule (100 mg total) by mouth every 8 (eight) hours as needed for cough. 21 capsule [place], Michele Rockers, [place]      PDMP not reviewed this encounter.   Teodora Medici, Man 06/14/22 1308

## 2022-06-14 NOTE — ED Triage Notes (Signed)
Pt c/o sore throat, left ear ache, cough, nasal drainage and congestion, headache, onset ~ Monday

## 2022-06-16 ENCOUNTER — Emergency Department (HOSPITAL_BASED_OUTPATIENT_CLINIC_OR_DEPARTMENT_OTHER)
Admission: EM | Admit: 2022-06-16 | Discharge: 2022-06-16 | Disposition: A | Discharge status: Home / Self Care | Payer: BC Managed Care – PPO | Attending: Emergency Medicine | Admitting: Emergency Medicine

## 2022-06-16 ENCOUNTER — Other Ambulatory Visit: Payer: Self-pay

## 2022-06-16 ENCOUNTER — Emergency Department (HOSPITAL_BASED_OUTPATIENT_CLINIC_OR_DEPARTMENT_OTHER): Payer: BC Managed Care – PPO

## 2022-06-16 ENCOUNTER — Encounter (HOSPITAL_BASED_OUTPATIENT_CLINIC_OR_DEPARTMENT_OTHER): Payer: Self-pay

## 2022-06-16 ENCOUNTER — Other Ambulatory Visit (HOSPITAL_BASED_OUTPATIENT_CLINIC_OR_DEPARTMENT_OTHER): Payer: Self-pay

## 2022-06-16 DIAGNOSIS — U071 COVID-19: Secondary | ICD-10-CM

## 2022-06-16 DIAGNOSIS — Z91040 Latex allergy status: Secondary | ICD-10-CM | POA: Diagnosis not present

## 2022-06-16 DIAGNOSIS — R059 Cough, unspecified: Secondary | ICD-10-CM | POA: Diagnosis present

## 2022-06-16 LAB — RESP PANEL BY RT-PCR (FLU A&B, COVID) ARPGX2
Influenza A by PCR: NEGATIVE
Influenza B by PCR: NEGATIVE
SARS Coronavirus 2 by RT PCR: POSITIVE — AB

## 2022-06-16 MED ORDER — NIRMATRELVIR/RITONAVIR (PAXLOVID)TABLET
3.0000 | ORAL_TABLET | Freq: Two times a day (BID) | ORAL | 0 refills | Status: AC
  Filled 2022-06-16: qty 30, 5d supply, fill #0

## 2022-06-16 MED ORDER — ONDANSETRON 4 MG PO TBDP
4.0000 mg | ORAL_TABLET | Freq: Three times a day (TID) | ORAL | 0 refills | Status: DC | PRN
  Filled 2022-06-16: qty 10, 4d supply, fill #0

## 2022-06-16 NOTE — ED Triage Notes (Signed)
Pt c/o continued URI symptoms- cough, congestion, poor PO, seen at [facility] for same but states no covid test done there.

## 2022-06-16 NOTE — ED Notes (Signed)
Ambulated on r/a SpO2 97-100%, denies increased WOB, HR 98-104.

## 2022-06-16 NOTE — ED Notes (Signed)
Pt provided discharge instructions and prescription information. Pt was given the opportunity to ask questions and questions were answered.   

## 2022-06-16 NOTE — Discharge Instructions (Signed)
Please read and follow all provided instructions.  Your diagnoses today include:  1. COVID-19     Tests performed today include: Vital signs. See below for your results today.  COVID test -positive for COVID  Medications prescribed:  Zofran (ondansetron) - for nausea and vomiting  Take any prescribed medications only as directed. Treatment for your infection is aimed at treating the symptoms. There are no medications, such as antibiotics, that will cure your infection.   Home care instructions:  Follow any educational materials contained in this packet.   Your illness is contagious and can be spread to others, especially during the first 3 or 4 days. It cannot be cured by antibiotics or other medicines. Take basic precautions such as washing your hands often, covering your mouth when you cough or sneeze, and avoiding public places where you could spread your illness to others.   Please continue drinking plenty of fluids.  Use over-the-counter medicines as needed as directed on packaging for symptom relief.  You may also use ibuprofen or tylenol as directed on packaging for pain or fever.  Do not take multiple medicines containing Tylenol or acetaminophen to avoid taking too much of this medication.  If you are positive for Covid-19, you should isolate yourself and not be exposed to other people for 5 days after your symptoms began. If you are not feeling better at day 5, you need to isolate yourself for a total of 10 days. If you are feeling better by day 5, you should wear a mask properly, over your nose and mouth, at all times while around other people until 10 days after your symptoms started.   Follow-up instructions: Please follow-up with your primary care provider as needed for further evaluation of your symptoms if you are not feeling better.   Return instructions:  Please return to the Emergency Department if you experience worsening symptoms.  Return to the emergency department  if you have worsening shortness of breath breathing or increased work of breathing, persistent vomiting RETURN IMMEDIATELY IF you develop shortness of breath, confusion or altered mental status, a new rash, become dizzy, faint, or poorly responsive, or are unable to be cared for at home. Please return if you have persistent vomiting and cannot keep down fluids or develop a fever that is not controlled by tylenol or motrin.   Please return if you have any other emergent concerns.  Additional Information:  Your vital signs today were: BP 118/87   Pulse (!) 104   Temp 98.6 F (37 C)   Resp 16   LMP 12/29/2012   SpO2 98%  If your blood pressure (BP) was elevated above 135/85 this visit, please have this repeated by your doctor within one month. --------------

## 2022-06-16 NOTE — ED Provider Notes (Signed)
[facility] Provider Note   CSN: [id number] Arrival date & time: 06/16/22  1134     History  No chief complaint on file.   NOVALEIGH KOHLMAN is a 66 y.o. female.  Patient with history of hypertension, high cholesterol, asthma presents to the emergency department today for evaluation of upper respiratory infection symptoms.  Patient states that her symptoms started on 06/13/2022.  She followed up at urgent care the following day and was prescribed symptomatic treatment for likely viral URI.  Patient continues to have sore throat, cough, body aches.  No documented fevers but she has been having chills.  No shortness of breath.  No vomiting or diarrhea.       Home Medications Prior to Admission medications   Medication Sig Start Date End Date Taking? Authorizing Provider  nirmatrelvir/ritonavir EUA (PAXLOVID) 20 x 150 MG & 10 x '100MG'$  TABS Take 3 tablets by mouth 2 (two) times daily for 5 days. Patient GFR is >60. Take nirmatrelvir (150 mg) two tablets twice daily for 5 days and ritonavir (100 mg) one tablet twice daily for 5 days. 06/16/22 06/21/22 Yes Carlisle Cater, PA-C  ondansetron (ZOFRAN-ODT) 4 MG disintegrating tablet Take 1 tablet (4 mg total) by mouth every 8 (eight) hours as needed for nausea or vomiting. 06/16/22  Yes Carlisle Cater, PA-C  acetaminophen (TYLENOL) 500 MG tablet Take 1,000 mg by mouth every 6 (six) hours as needed for mild pain.    [provider]  amLODipine (NORVASC) 2.5 MG tablet Take by mouth. 05/24/20   [provider]  azelastine (ASTELIN) 0.1 % nasal spray SMARTSIG:1-2 Spray(s) Both Nares 1-2 Times Daily 05/13/21   [provider]  benzonatate (TESSALON) 100 MG capsule Take 1 capsule (100 mg total) by mouth every 8 (eight) hours as needed for cough. 06/14/22   Teodora Medici, FNP  betamethasone dipropionate 0.05 % cream Apply 1 application topically 2 (two) times daily. 12/04/20   [provider]   cetirizine (ZYRTEC) 10 MG tablet Take 1 tablet (10 mg total) by mouth daily for 10 days. 06/14/22 06/24/22  Teodora Medici, FNP  dicyclomine (BENTYL) 20 MG tablet Take 1 tablet (20 mg total) by mouth 2 (two) times daily. 11/03/20   Garald Balding, PA-C  ergocalciferol (VITAMIN D2) 1.25 MG (50000 UT) capsule Take 1 capsule by mouth once a week. 09/22/13   [provider]  hydrochlorothiazide (HYDRODIURIL) 12.5 MG tablet Take 12.5 mg by mouth every morning. 08/04/20   [provider]  ibuprofen (ADVIL) 600 MG tablet Take 1,200 mg by mouth 2 (two) times daily as needed. 08/11/20   [provider]  ketorolac (TORADOL) 10 MG tablet Take 1 tablet (10 mg total) by mouth every 6 (six) hours as needed. 05/31/21   Prosperi, Christian H, PA-C  lidocaine (LIDODERM) 5 % Place 1 patch onto the skin daily. Remove & Discard patch within 12 hours or as directed by MD 05/31/21   Prosperi, Darrick Meigs H, PA-C  meloxicam (MOBIC) 15 MG tablet Take 1 tablet (15 mg total) by mouth daily. 05/14/21   Lorenda Peck, DPM  methocarbamol (ROBAXIN) 500 MG tablet Take 1 tablet (500 mg total) by mouth 2 (two) times daily. 05/31/21   Prosperi, Christian H, PA-C  nystatin (MYCOSTATIN/NYSTOP) powder Apply 1 Application topically 3 (three) times daily. Clean and dry belly button area and apply powder 3x per day until rash is gone. 06/04/22   Godfrey Pick, MD  omeprazole (PRILOSEC) 20 MG capsule  Take 20 mg by mouth daily.    [provider]  traMADol (ULTRAM) 50 MG tablet Take 50 mg by mouth every 8 (eight) hours as needed. 09/13/20   [provider]      Allergies    Codeine, Morphine and related, Latex, Lisinopril, Lovastatin, and Prednisone    Review of Systems   Review of Systems  Physical Exam Updated Vital Signs BP 118/87   Pulse (!) 104   Temp 98.6 F (37 C)   Resp 16   LMP 12/29/2012   SpO2 98%  Physical Exam Vitals and nursing note reviewed.  Constitutional:       Appearance: She is well-developed.  HENT:     Head: Normocephalic and atraumatic.     Jaw: No trismus.     Right Ear: External ear normal.     Left Ear: External ear normal.     Nose: Nose normal. No mucosal edema or rhinorrhea.     Mouth/Throat:     Mouth: Mucous membranes are moist. Mucous membranes are not dry. No oral lesions.     Pharynx: Uvula midline. No oropharyngeal exudate, posterior oropharyngeal erythema or uvula swelling.     Tonsils: No tonsillar abscesses.  Eyes:     General:        Right eye: No discharge.        Left eye: No discharge.     Conjunctiva/sclera: Conjunctivae normal.  Cardiovascular:     Rate and Rhythm: Normal rate and regular rhythm.     Heart sounds: Normal heart sounds.  Pulmonary:     Effort: Pulmonary effort is normal. No respiratory distress.     Breath sounds: Normal breath sounds. No wheezing or rales.     Comments: Lungs clear to auscultation bilaterally Abdominal:     Palpations: Abdomen is soft.     Tenderness: There is no abdominal tenderness.  Musculoskeletal:     Cervical back: Normal range of motion and neck supple.     Right lower leg: No edema.     Left lower leg: No edema.  Lymphadenopathy:     Cervical: No cervical adenopathy.  Skin:    General: Skin is warm and dry.  Neurological:     Mental Status: She is alert.  Psychiatric:        Mood and Affect: Mood normal.     ED Results / Procedures / Treatments   Labs (all labs ordered are listed, but only abnormal results are displayed) Labs Reviewed  RESP PANEL BY RT-PCR (FLU A&B, COVID) ARPGX2 - Abnormal; Notable for the following components:      Result Value   SARS Coronavirus 2 by RT PCR POSITIVE (*)    All other components within normal limits    EKG None  Radiology DG Chest 2 View  Result Date: 06/16/2022 CLINICAL DATA:  Cough, sore throat, congestion EXAM: CHEST - 2 VIEW COMPARISON:  05/04/2018 FINDINGS: Cardiac and mediastinal contours are within normal  limits. No focal pulmonary opacity. No pleural effusion or pneumothorax. No acute osseous abnormality. IMPRESSION: No acute cardiopulmonary process. Electronically Signed   By: Merilyn Baba M.D.   On: 06/16/2022 12:45    Procedures Procedures    Medications Ordered in ED Medications - No data to display  ED Course/ Medical Decision Making/ A&P    Patient seen and examined. History obtained directly from patient. Work-up including labs, imaging, EKG ordered in triage, if performed, were reviewed.    Labs/EKG: COVID and flu, COVID  positive.  Imaging: Independently visualized and interpreted.  This included: Chest x-ray, agree negative  Medications/Fluids: None ordered  Most recent vital signs reviewed and are as follows: BP 118/87   Pulse (!) 104   Temp 98.6 F (37 C)   Resp 16   LMP 12/29/2012   SpO2 98%   Initial impression: COVID infection  Plan: Discharge to home.   Prescriptions written for: Paxlovid, Zofran  Detailed discussion had with with patient regarding COVID-19 precautions and written instructions given as well.  We discussed need to isolate themselves for 5 days from onset of symptoms and have 24 hours of improvement prior to breaking isolation.  We discussed that when breaking isolation, mask wearing for 5 additional days is required.  We discussed signs symptoms to return which include worsening shortness of breath, trouble breathing, or increased work of breathing.  Also return with persistent vomiting, confusion, passing out, or if they have any other concerns. Counseled on the need for rest and good hydration. Discussed that high-risk contacts should be aware of positive result and they need to quarantine and be tested if they develop any symptoms. Patient verbalizes understanding.   MACKINZEE ROSZAK was evaluated in Emergency Department on 06/16/2022 for the symptoms described in the history of present illness. She was evaluated in the context of the global  COVID-19 pandemic, which necessitated consideration that the patient might be at risk for infection with the SARS-CoV-2 virus that causes COVID-19. Institutional protocols and algorithms that pertain to the evaluation of patients at risk for COVID-19 are in a state of rapid change based on information released by regulatory bodies including the CDC and federal and state organizations. These policies and algorithms were followed during the patient's care in the ED.                            Medical Decision Making Amount and/or Complexity of Data Reviewed Radiology: ordered.  Risk Prescription drug management.   Patient with upper respiratory symptoms, well-appearing, normal oxygen saturation.  Tested positive for COVID today.  Chest x-ray negative for pneumonia.  Low concern for ACS, PE.  The patient's vital signs, pertinent lab work and imaging were reviewed and interpreted as discussed in the ED course. Hospitalization was considered for further testing, treatments, or serial exams/observation. However as patient is well-appearing, has a stable exam, and reassuring studies today, I do not feel that they warrant admission at this time. This plan was discussed with the patient who verbalizes agreement and comfort with this plan and seems reliable and able to return to the Emergency Department with worsening or changing symptoms.          Final Clinical Impression(s) / ED Diagnoses Final diagnoses:  COVID-19    Rx / DC Orders ED Discharge Orders          Ordered    nirmatrelvir/ritonavir EUA (PAXLOVID) 20 x 150 MG & 10 x '100MG'$  TABS  2 times daily        06/16/22 1627    ondansetron (ZOFRAN-ODT) 4 MG disintegrating tablet  Every 8 hours PRN        06/16/22 1627              Carlisle Cater, PA-C 06/16/22 1631    Regan Lemming, MD 06/16/22 1905

## 2022-06-25 ENCOUNTER — Ambulatory Visit (HOSPITAL_BASED_OUTPATIENT_CLINIC_OR_DEPARTMENT_OTHER): Payer: BC Managed Care – PPO | Admitting: Cardiovascular Disease

## 2022-07-05 DIAGNOSIS — M542 Cervicalgia: Secondary | ICD-10-CM | POA: Insufficient documentation

## 2022-07-05 DIAGNOSIS — M545 Low back pain, unspecified: Secondary | ICD-10-CM | POA: Insufficient documentation

## 2022-07-11 DIAGNOSIS — M47812 Spondylosis without myelopathy or radiculopathy, cervical region: Secondary | ICD-10-CM | POA: Insufficient documentation

## 2022-07-11 DIAGNOSIS — M25512 Pain in left shoulder: Secondary | ICD-10-CM | POA: Insufficient documentation

## 2022-07-25 DIAGNOSIS — M25522 Pain in left elbow: Secondary | ICD-10-CM | POA: Insufficient documentation

## 2022-08-20 DIAGNOSIS — M7712 Lateral epicondylitis, left elbow: Secondary | ICD-10-CM | POA: Insufficient documentation

## 2022-09-17 DIAGNOSIS — G5602 Carpal tunnel syndrome, left upper limb: Secondary | ICD-10-CM | POA: Diagnosis not present

## 2022-10-06 DIAGNOSIS — M5412 Radiculopathy, cervical region: Secondary | ICD-10-CM | POA: Insufficient documentation

## 2022-11-09 ENCOUNTER — Encounter: Payer: Self-pay | Admitting: *Deleted

## 2022-11-09 ENCOUNTER — Ambulatory Visit
Admission: EM | Admit: 2022-11-09 | Discharge: 2022-11-09 | Disposition: A | Discharge status: Home / Self Care | Payer: BC Managed Care – PPO | Attending: Internal Medicine | Admitting: Internal Medicine

## 2022-11-09 ENCOUNTER — Other Ambulatory Visit: Payer: Self-pay

## 2022-11-09 DIAGNOSIS — B029 Zoster without complications: Secondary | ICD-10-CM

## 2022-11-09 DIAGNOSIS — M10071 Idiopathic gout, right ankle and foot: Secondary | ICD-10-CM

## 2022-11-09 MED ORDER — IBUPROFEN 600 MG PO TABS: 600.0000 mg | ORAL_TABLET | Freq: Four times a day (QID) | ORAL | 0 refills | Status: DC | PRN

## 2022-11-09 MED ORDER — VALACYCLOVIR HCL 1 G PO TABS: 1000.0000 mg | ORAL_TABLET | Freq: Three times a day (TID) | ORAL | 0 refills | Status: AC

## 2022-11-09 NOTE — ED Triage Notes (Signed)
Pt  reports a painful rash on Lt lower back that started on Friday. Pt also has painful rsh on ABD.Pt has pain and swelling to RT ankle 3 days ago with no injury.

## 2022-11-09 NOTE — Discharge Instructions (Addendum)
Please apply calamine lotion to the rash Please take medications as prescribed Increase oral fluid intake Icing of the right ankle intermittently as needed As the pain improves increase range of motion exercises Please return to urgent care if symptoms worsen

## 2022-11-09 NOTE — ED Provider Notes (Signed)
[facility]    CSN: [id number] Arrival date & time: 11/09/22  1331      History   Chief Complaint Chief Complaint  Patient presents with   Ankle Pain   Rash    HPI Brenda Peters is a 67 y.o. female the urgent care with painful rash over the left flank region spreading to the left lower quadrant of the abdominal wall.  Patient symptoms started a few days ago with a rash in the back.  The rash spread around the flank area to the left lower quadrant abdomen.  Rash is painful.  The rash is vesicular.  No discharge.  No fever or chills.  Patient has a history of gout.  She comes in with right ankle pain of 3 days duration.  Pain is sharp and throbbing in nature.  Pain is not aggravated by bearing weight.  Pain is aggravated by palpation.  No significant erythema.  No trauma to the right ankle.  No fever or chills.   HPI  Past Medical History:  Diagnosis Date   Asthma    Carpal tunnel syndrome    Heart murmur    Heart palpitations    High cholesterol    Hypertension     Patient Active Problem List   Diagnosis Date Noted   Cervical spondylosis without myelopathy 09/09/2019   Ankle pain, chronic 09/21/2013   Dry eye syndrome 06/02/2013   Family history of premature CAD 07/26/2012   Carpal tunnel syndrome on right 01/21/2011   HTN (hypertension) 01/21/2011    Past Surgical History:  Procedure Laterality Date   TUBAL LIGATION      OB History   No obstetric history on file.      Home Medications    Prior to Admission medications   Medication Sig Start Date End Date Taking? Authorizing Provider  ibuprofen (ADVIL) 600 MG tablet Take 1 tablet (600 mg total) by mouth every 6 (six) hours as needed. 11/09/22  Yes Murel Wigle, Britta Mccreedy, MD  valACYclovir (VALTREX) 1000 MG tablet Take 1 tablet (1,000 mg total) by mouth 3 (three) times daily for 7 days. 11/09/22 11/16/22 Yes [place] Maulden, Britta Mccreedy, MD  acetaminophen (TYLENOL) 500 MG tablet Take 1,000 mg by mouth every 6  (six) hours as needed for mild pain.    [provider]  amLODipine (NORVASC) 2.5 MG tablet Take by mouth. 05/24/20   [provider]  azelastine (ASTELIN) 0.1 % nasal spray SMARTSIG:1-2 Spray(s) Both Nares 1-2 Times Daily 05/13/21   [provider]  benzonatate (TESSALON) 100 MG capsule Take 1 capsule (100 mg total) by mouth every 8 (eight) hours as needed for cough. 06/14/22   Gustavus Bryant, FNP  betamethasone dipropionate 0.05 % cream Apply 1 application topically 2 (two) times daily. 12/04/20   [provider]  cetirizine (ZYRTEC) 10 MG tablet Take 1 tablet (10 mg total) by mouth daily for 10 days. 06/14/22 06/24/22  Gustavus Bryant, FNP  dicyclomine (BENTYL) 20 MG tablet Take 1 tablet (20 mg total) by mouth 2 (two) times daily. 11/03/20   Mare Ferrari, PA-C  ergocalciferol (VITAMIN D2) 1.25 MG (50000 UT) capsule Take 1 capsule by mouth once a week. 09/22/13   [provider]  hydrochlorothiazide (HYDRODIURIL) 12.5 MG tablet Take 12.5 mg by mouth every morning. 08/04/20   [provider]  lidocaine (LIDODERM) 5 % Place 1 patch onto the skin daily. Remove & Discard patch within 12 hours or as directed by MD 05/31/21  Prosperi, Christian H, PA-C  meloxicam (MOBIC) 15 MG tablet Take 1 tablet (15 mg total) by mouth daily. 05/14/21   Louann Sjogren, DPM  methocarbamol (ROBAXIN) 500 MG tablet Take 1 tablet (500 mg total) by mouth 2 (two) times daily. 05/31/21   Prosperi, Christian H, PA-C  nystatin (MYCOSTATIN/NYSTOP) powder Apply 1 Application topically 3 (three) times daily. Clean and dry belly button area and apply powder 3x per day until rash is gone. 06/04/22   Gloris Manchester, MD  omeprazole (PRILOSEC) 20 MG capsule Take 20 mg by mouth daily.    [provider]  ondansetron (ZOFRAN-ODT) 4 MG disintegrating tablet Take 1 tablet (4 mg total) by mouth every 8 (eight) hours as needed for nausea or vomiting. 06/16/22   Renne Crigler, PA-C   traMADol (ULTRAM) 50 MG tablet Take 50 mg by mouth every 8 (eight) hours as needed. 09/13/20   [provider]    Family History Family History  Problem Relation Age of Onset   Diabetes Mother    Hypertension Mother     Social History Social History   Tobacco Use   Smoking status: Never   Smokeless tobacco: Never  Vaping Use   Vaping Use: Never used  Substance Use Topics   Alcohol use: No   Drug use: No     Allergies   Codeine, Morphine and related, Latex, Lisinopril, Lovastatin, and Prednisone   Review of Systems Review of Systems As per HPI  Physical Exam Triage Vital Signs ED Triage Vitals  Enc Vitals Group     BP 11/09/22 1416 (!) 140/85     Pulse Rate 11/09/22 1416 81     Resp 11/09/22 1416 18     Temp 11/09/22 1416 97.8 F (36.6 C)     Temp src --      SpO2 11/09/22 1416 93 %     Weight --      Height --      Head Circumference --      Peak Flow --      Pain Score 11/09/22 1413 10     Pain Loc --      Pain Edu? --      Excl. in GC? --    No data found.  Updated Vital Signs BP (!) 140/85   Pulse 81   Temp 97.8 F (36.6 C)   Resp 18   LMP 12/29/2012   SpO2 93%   Visual Acuity Right Eye Distance:   Left Eye Distance:   Bilateral Distance:    Right Eye Near:   Left Eye Near:    Bilateral Near:     Physical Exam Vitals and nursing note reviewed.  Constitutional:      General: She is not in acute distress.    Appearance: Normal appearance. She is not ill-appearing.  Cardiovascular:     Rate and Rhythm: Normal rate and regular rhythm.  Abdominal:     General: Bowel sounds are normal.     Palpations: Abdomen is soft.  Musculoskeletal:        General: Swelling and tenderness present. No deformity or signs of injury. Normal range of motion.  Skin:    Comments: Vesicular rash in the left flank area spreading to the left lower abdominal wall.  The area involved follows dermatome.  The area involved is mildly erythematous.  No  discharge noted.  Neurological:     Mental Status: She is alert.      UC Treatments / Results  Labs (all labs ordered are listed, but only abnormal results are displayed) Labs Reviewed - No data to display  EKG   Radiology No results found.  Procedures Procedures (including critical care time)  Medications Ordered in UC Medications - No data to display  Initial Impression / Assessment and Plan / UC Course  I have reviewed the triage vital signs and the nursing notes.  Pertinent labs & imaging results that were available during my care of the patient were reviewed by me and considered in my medical decision making (see chart for details).     1.  Herpes zoster without complication: Valacyclovir 1 g 3 times daily for 7 days Ibuprofen or Tylenol as needed for pain and/or fever Maintain adequate hydration Return to urgent care if symptoms worsen  2.  Acute idiopathic gout flare involving the right ankle: Rest, elevation, icing. Ibuprofen as needed for pain Return precautions given Final Clinical Impressions(s) / UC Diagnoses   Final diagnoses:  Herpes zoster without complication  Acute idiopathic gout of right ankle     Discharge Instructions      Please apply calamine lotion to the rash Please take medications as prescribed Increase oral fluid intake Icing of the right ankle intermittently as needed As the pain improves increase range of motion exercises Please return to urgent care if symptoms worsen   ED Prescriptions     Medication Sig Dispense Auth. Provider   valACYclovir (VALTREX) 1000 MG tablet Take 1 tablet (1,000 mg total) by mouth 3 (three) times daily for 7 days. 21 tablet Teresina Bugaj, Britta Mccreedy, MD   ibuprofen (ADVIL) 600 MG tablet Take 1 tablet (600 mg total) by mouth every 6 (six) hours as needed. 30 tablet Benford Asch, Britta Mccreedy, MD      PDMP not reviewed this encounter.   Merrilee Jansky, MD 11/09/22 [phone]

## 2022-12-12 ENCOUNTER — Ambulatory Visit
Admission: EM | Admit: 2022-12-12 | Discharge: 2022-12-12 | Disposition: A | Discharge status: Home / Self Care | Payer: BC Managed Care – PPO | Attending: Internal Medicine | Admitting: Internal Medicine

## 2022-12-12 DIAGNOSIS — K047 Periapical abscess without sinus: Secondary | ICD-10-CM

## 2022-12-12 DIAGNOSIS — K0889 Other specified disorders of teeth and supporting structures: Secondary | ICD-10-CM

## 2022-12-12 MED ORDER — AMOXICILLIN-POT CLAVULANATE 875-125 MG PO TABS: 1.0000 | ORAL_TABLET | Freq: Two times a day (BID) | ORAL | 0 refills | Status: DC

## 2022-12-12 NOTE — Discharge Instructions (Signed)
You have a dental infection which is being treated with an antibiotic.  Please follow-up with dentist for further evaluation and management. 

## 2022-12-12 NOTE — ED Provider Notes (Signed)
[facility]    CSN: [id number] Arrival date & time: 12/12/22  1039      History   Chief Complaint Chief Complaint  Patient presents with   Oral Swelling    HPI Brenda Peters is a 67 y.o. female.   Patient presents with left upper dental pain that started about 2 days ago.  Reports that she has an appointment with a dentist in 2 weeks.  She has taken Tylenol for symptoms with improvement in pain.  Denies any associated fever. Denies trauma to the face.      Past Medical History:  Diagnosis Date   Asthma    Carpal tunnel syndrome    Heart murmur    Heart palpitations    High cholesterol    Hypertension     Patient Active Problem List   Diagnosis Date Noted   Cervical spondylosis without myelopathy 09/09/2019   Ankle pain, chronic 09/21/2013   Dry eye syndrome 06/02/2013   Family history of premature CAD 07/26/2012   Carpal tunnel syndrome on right 01/21/2011   HTN (hypertension) 01/21/2011    Past Surgical History:  Procedure Laterality Date   TUBAL LIGATION      OB History   No obstetric history on file.      Home Medications    Prior to Admission medications   Medication Sig Start Date End Date Taking? Authorizing Provider  amoxicillin-clavulanate (AUGMENTIN) 875-125 MG tablet Take 1 tablet by mouth every 12 (twelve) hours. 12/12/22  Yes Yasser Hepp, Rolly Salter E, FNP  acetaminophen (TYLENOL) 500 MG tablet Take 1,000 mg by mouth every 6 (six) hours as needed for mild pain.    [provider]  amLODipine (NORVASC) 2.5 MG tablet Take by mouth. 05/24/20   [provider]  azelastine (ASTELIN) 0.1 % nasal spray SMARTSIG:1-2 Spray(s) Both Nares 1-2 Times Daily 05/13/21   [provider]  benzonatate (TESSALON) 100 MG capsule Take 1 capsule (100 mg total) by mouth every 8 (eight) hours as needed for cough. 06/14/22   Gustavus Bryant, FNP  betamethasone dipropionate 0.05 % cream Apply 1 application topically 2 (two) times daily.  12/04/20   [provider]  cetirizine (ZYRTEC) 10 MG tablet Take 1 tablet (10 mg total) by mouth daily for 10 days. 06/14/22 06/24/22  Gustavus Bryant, FNP  dicyclomine (BENTYL) 20 MG tablet Take 1 tablet (20 mg total) by mouth 2 (two) times daily. 11/03/20   Mare Ferrari, PA-C  ergocalciferol (VITAMIN D2) 1.25 MG (50000 UT) capsule Take 1 capsule by mouth once a week. 09/22/13   [provider]  hydrochlorothiazide (HYDRODIURIL) 12.5 MG tablet Take 12.5 mg by mouth every morning. 08/04/20   [provider]  ibuprofen (ADVIL) 600 MG tablet Take 1 tablet (600 mg total) by mouth every 6 (six) hours as needed. 11/09/22   Lamptey, Britta Mccreedy, MD  lidocaine (LIDODERM) 5 % Place 1 patch onto the skin daily. Remove & Discard patch within 12 hours or as directed by MD 05/31/21   Prosperi, Ephriam Knuckles H, PA-C  meloxicam (MOBIC) 15 MG tablet Take 1 tablet (15 mg total) by mouth daily. 05/14/21   Louann Sjogren, DPM  methocarbamol (ROBAXIN) 500 MG tablet Take 1 tablet (500 mg total) by mouth 2 (two) times daily. 05/31/21   Prosperi, Christian H, PA-C  nystatin (MYCOSTATIN/NYSTOP) powder Apply 1 Application topically 3 (three) times daily. Clean and dry belly button area and apply powder 3x per day until rash is gone. 06/04/22  Gloris Manchester, MD  omeprazole (PRILOSEC) 20 MG capsule Take 20 mg by mouth daily.    [provider]  ondansetron (ZOFRAN-ODT) 4 MG disintegrating tablet Take 1 tablet (4 mg total) by mouth every 8 (eight) hours as needed for nausea or vomiting. 06/16/22   Renne Crigler, PA-C  traMADol (ULTRAM) 50 MG tablet Take 50 mg by mouth every 8 (eight) hours as needed. 09/13/20   [provider]    Family History Family History  Problem Relation Age of Onset   Diabetes Mother    Hypertension Mother     Social History Social History   Tobacco Use   Smoking status: Never   Smokeless tobacco: Never  Vaping Use   Vaping Use: Never used  Substance Use  Topics   Alcohol use: No   Drug use: No     Allergies   Codeine, Morphine and codeine, Latex, Lisinopril, Lovastatin, and Prednisone   Review of Systems Review of Systems Per HPI  Physical Exam Triage Vital Signs ED Triage Vitals [12/12/22 1132]  Enc Vitals Group     BP (!) 157/97     Pulse Rate 74     Resp 16     Temp 98.2 F (36.8 C)     Temp Source Oral     SpO2 98 %     Weight      Height      Head Circumference      Peak Flow      Pain Score 10     Pain Loc      Pain Edu?      Excl. in GC?    No data found.  Updated Vital Signs BP (!) 157/97 (BP Location: Left Arm)   Pulse 74   Temp 98.2 F (36.8 C) (Oral)   Resp 16   LMP 12/29/2012   SpO2 98%   Visual Acuity Right Eye Distance:   Left Eye Distance:   Bilateral Distance:    Right Eye Near:   Left Eye Near:    Bilateral Near:     Physical Exam Constitutional:      General: She is not in acute distress.    Appearance: Normal appearance. She is not toxic-appearing or diaphoretic.  HENT:     Head: Normocephalic and atraumatic.     Mouth/Throat:      Comments: Patient has swelling and erythema surrounding the gingiva of the left upper back tooth. Eyes:     Extraocular Movements: Extraocular movements intact.     Conjunctiva/sclera: Conjunctivae normal.  Pulmonary:     Effort: Pulmonary effort is normal.  Neurological:     General: No focal deficit present.     Mental Status: She is alert and oriented to person, place, and time. Mental status is at baseline.  Psychiatric:        Mood and Affect: Mood normal.        Behavior: Behavior normal.        Thought Content: Thought content normal.        Judgment: Judgment normal.      UC Treatments / Results  Labs (all labs ordered are listed, but only abnormal results are displayed) Labs Reviewed - No data to display  EKG   Radiology No results found.  Procedures Procedures (including critical care time)  Medications Ordered in  UC Medications - No data to display  Initial Impression / Assessment and Plan / UC Course  I have reviewed the triage vital signs  and the nursing notes.  Pertinent labs & imaging results that were available during my care of the patient were reviewed by me and considered in my medical decision making (see chart for details).     Suspect dental infection.  Will treat with Augmentin.  Advised supportive care and symptom management with patient.  Advised following up with dentist for further evaluation and management.  Patient verbalized understanding and was agreeable with plan. Final Clinical Impressions(s) / UC Diagnoses   Final diagnoses:  Dental infection  Pain, dental     Discharge Instructions      You have a dental infection which is being treated with an antibiotic.  Please follow-up with dentist for further evaluation and management.    ED Prescriptions     Medication Sig Dispense Auth. Provider   amoxicillin-clavulanate (AUGMENTIN) 875-125 MG tablet Take 1 tablet by mouth every 12 (twelve) hours. 14 tablet [place], Acie Fredrickson, [place]      PDMP not reviewed this encounter.   Gustavus Bryant, [place] 12/12/22 1230

## 2022-12-12 NOTE — ED Triage Notes (Signed)
Pt states dental pain to upper left jaw for the past 2 days.  States she wasn't able to get  a dentist appointment for 2 weeks

## 2023-01-22 DIAGNOSIS — G5602 Carpal tunnel syndrome, left upper limb: Secondary | ICD-10-CM | POA: Diagnosis not present

## 2023-01-22 DIAGNOSIS — M79644 Pain in right finger(s): Secondary | ICD-10-CM | POA: Insufficient documentation

## 2023-01-22 DIAGNOSIS — M65331 Trigger finger, right middle finger: Secondary | ICD-10-CM | POA: Diagnosis not present

## 2023-02-03 DIAGNOSIS — R2689 Other abnormalities of gait and mobility: Secondary | ICD-10-CM | POA: Insufficient documentation

## 2023-02-03 DIAGNOSIS — M542 Cervicalgia: Secondary | ICD-10-CM | POA: Insufficient documentation

## 2023-03-12 DIAGNOSIS — M542 Cervicalgia: Secondary | ICD-10-CM | POA: Diagnosis not present

## 2023-03-12 DIAGNOSIS — R278 Other lack of coordination: Secondary | ICD-10-CM | POA: Diagnosis not present

## 2023-03-18 DIAGNOSIS — R278 Other lack of coordination: Secondary | ICD-10-CM | POA: Diagnosis not present

## 2023-03-18 DIAGNOSIS — M542 Cervicalgia: Secondary | ICD-10-CM | POA: Diagnosis not present

## 2023-03-24 DIAGNOSIS — R278 Other lack of coordination: Secondary | ICD-10-CM | POA: Diagnosis not present

## 2023-03-24 DIAGNOSIS — M542 Cervicalgia: Secondary | ICD-10-CM | POA: Diagnosis not present

## 2023-03-26 DIAGNOSIS — M25561 Pain in right knee: Secondary | ICD-10-CM | POA: Diagnosis not present

## 2023-04-03 DIAGNOSIS — M94261 Chondromalacia, right knee: Secondary | ICD-10-CM | POA: Diagnosis not present

## 2023-04-03 DIAGNOSIS — M2351 Chronic instability of knee, right knee: Secondary | ICD-10-CM | POA: Diagnosis not present

## 2023-04-03 DIAGNOSIS — M67961 Unspecified disorder of synovium and tendon, right lower leg: Secondary | ICD-10-CM | POA: Diagnosis not present

## 2023-04-03 DIAGNOSIS — M1711 Unilateral primary osteoarthritis, right knee: Secondary | ICD-10-CM | POA: Diagnosis not present

## 2023-04-03 DIAGNOSIS — M25461 Effusion, right knee: Secondary | ICD-10-CM | POA: Diagnosis not present

## 2023-04-03 DIAGNOSIS — M948X6 Other specified disorders of cartilage, lower leg: Secondary | ICD-10-CM | POA: Diagnosis not present

## 2023-04-15 DIAGNOSIS — M170 Bilateral primary osteoarthritis of knee: Secondary | ICD-10-CM | POA: Diagnosis not present

## 2023-04-15 DIAGNOSIS — M2241 Chondromalacia patellae, right knee: Secondary | ICD-10-CM | POA: Diagnosis not present

## 2023-04-23 ENCOUNTER — Telehealth: Payer: Self-pay

## 2023-04-23 ENCOUNTER — Ambulatory Visit
Admission: EM | Admit: 2023-04-23 | Discharge: 2023-04-23 | Disposition: A | Discharge status: Home / Self Care | Payer: BC Managed Care – PPO | Attending: Internal Medicine | Admitting: Internal Medicine

## 2023-04-23 DIAGNOSIS — I10 Essential (primary) hypertension: Secondary | ICD-10-CM | POA: Insufficient documentation

## 2023-04-23 DIAGNOSIS — J029 Acute pharyngitis, unspecified: Secondary | ICD-10-CM

## 2023-04-23 DIAGNOSIS — R6884 Jaw pain: Secondary | ICD-10-CM | POA: Diagnosis present

## 2023-04-23 DIAGNOSIS — J069 Acute upper respiratory infection, unspecified: Secondary | ICD-10-CM | POA: Diagnosis not present

## 2023-04-23 DIAGNOSIS — R059 Cough, unspecified: Secondary | ICD-10-CM | POA: Diagnosis not present

## 2023-04-23 DIAGNOSIS — Z1152 Encounter for screening for COVID-19: Secondary | ICD-10-CM | POA: Insufficient documentation

## 2023-04-23 DIAGNOSIS — B9789 Other viral agents as the cause of diseases classified elsewhere: Secondary | ICD-10-CM | POA: Diagnosis not present

## 2023-04-23 DIAGNOSIS — Z79899 Other long term (current) drug therapy: Secondary | ICD-10-CM | POA: Diagnosis not present

## 2023-04-23 LAB — POCT RAPID STREP A (OFFICE): Rapid Strep A Screen: NEGATIVE

## 2023-04-23 MED ORDER — GUAIFENESIN 200 MG PO TABS: 200.0000 mg | ORAL_TABLET | ORAL | 0 refills | Status: AC | PRN

## 2023-04-23 NOTE — ED Triage Notes (Signed)
Pt presents to the office for headache, ear pain and jaw pain x 2 days. Pt stated her headaches are coming from him Amlodipine 5 mg.

## 2023-04-23 NOTE — ED Provider Notes (Signed)
[facility]    CSN: [id number] Arrival date & time: 04/23/23  5284      History   Chief Complaint Chief Complaint  Patient presents with   Headache   Otalgia   Jaw Pain    HPI Brenda Peters is a 67 y.o. female.   Patient presents with headache, right ear pain, right-sided jaw pain, sore throat, nasal congestion, runny nose, cough that started 2 days ago.  Denies any known sick contacts or fever at home.  Patient has taken Coricidin and Flonase for symptoms.  Patient is concerned that headaches are related to amlodipine as she was recently switched to amlodipine about 2 weeks ago and started having headaches intermittently.  Patient is not reporting any dizziness, nausea, vomiting, chest pain, shortness of breath.   Headache Otalgia   Past Medical History:  Diagnosis Date   Asthma    Carpal tunnel syndrome    Heart murmur    Heart palpitations    High cholesterol    Hypertension     Patient Active Problem List   Diagnosis Date Noted   Cervical spondylosis without myelopathy 09/09/2019   Ankle pain, chronic 09/21/2013   Dry eye syndrome 06/02/2013   Family history of premature CAD 07/26/2012   Carpal tunnel syndrome on right 01/21/2011   HTN (hypertension) 01/21/2011    Past Surgical History:  Procedure Laterality Date   TUBAL LIGATION      OB History   No obstetric history on file.      Home Medications    Prior to Admission medications   Medication Sig Start Date End Date Taking? Authorizing Provider  amLODipine (NORVASC) 2.5 MG tablet Take by mouth. 05/24/20  Yes [provider]  guaiFENesin 200 MG tablet Take 1 tablet (200 mg total) by mouth every 4 (four) hours as needed for cough or to loosen phlegm. 04/23/23  Yes Ma Munoz, Rolly Salter E, FNP  omeprazole (PRILOSEC) 20 MG capsule Take 20 mg by mouth daily.   Yes [provider]  acetaminophen (TYLENOL) 500 MG tablet Take 1,000 mg by mouth every 6 (six) hours as needed for  mild pain.    [provider]  amoxicillin-clavulanate (AUGMENTIN) 875-125 MG tablet Take 1 tablet by mouth every 12 (twelve) hours. 12/12/22   Gustavus Bryant, FNP  azelastine (ASTELIN) 0.1 % nasal spray SMARTSIG:1-2 Spray(s) Both Nares 1-2 Times Daily 05/13/21   [provider]  benzonatate (TESSALON) 100 MG capsule Take 1 capsule (100 mg total) by mouth every 8 (eight) hours as needed for cough. 06/14/22   Gustavus Bryant, FNP  betamethasone dipropionate 0.05 % cream Apply 1 application topically 2 (two) times daily. 12/04/20   [provider]  cetirizine (ZYRTEC) 10 MG tablet Take 1 tablet (10 mg total) by mouth daily for 10 days. 06/14/22 06/24/22  Gustavus Bryant, FNP  dicyclomine (BENTYL) 20 MG tablet Take 1 tablet (20 mg total) by mouth 2 (two) times daily. 11/03/20   Mare Ferrari, PA-C  ergocalciferol (VITAMIN D2) 1.25 MG (50000 UT) capsule Take 1 capsule by mouth once a week. 09/22/13   [provider]  hydrochlorothiazide (HYDRODIURIL) 12.5 MG tablet Take 12.5 mg by mouth every morning. 08/04/20   [provider]  ibuprofen (ADVIL) 600 MG tablet Take 1 tablet (600 mg total) by mouth every 6 (six) hours as needed. 11/09/22   Lamptey, Britta Mccreedy, MD  lidocaine (LIDODERM) 5 % Place 1 patch onto the skin daily. Remove & Discard patch  within 12 hours or as directed by MD 05/31/21   Prosperi, Ephriam Knuckles H, PA-C  meloxicam (MOBIC) 15 MG tablet Take 1 tablet (15 mg total) by mouth daily. 05/14/21   Louann Sjogren, DPM  methocarbamol (ROBAXIN) 500 MG tablet Take 1 tablet (500 mg total) by mouth 2 (two) times daily. 05/31/21   Prosperi, Christian H, PA-C  nystatin (MYCOSTATIN/NYSTOP) powder Apply 1 Application topically 3 (three) times daily. Clean and dry belly button area and apply powder 3x per day until rash is gone. 06/04/22   Gloris Manchester, MD  ondansetron (ZOFRAN-ODT) 4 MG disintegrating tablet Take 1 tablet (4 mg total) by mouth every 8 (eight) hours as needed  for nausea or vomiting. 06/16/22   Renne Crigler, PA-C  traMADol (ULTRAM) 50 MG tablet Take 50 mg by mouth every 8 (eight) hours as needed. 09/13/20   [provider]    Family History Family History  Problem Relation Age of Onset   Diabetes Mother    Hypertension Mother     Social History Social History   Tobacco Use   Smoking status: Never   Smokeless tobacco: Never  Vaping Use   Vaping status: Never Used  Substance Use Topics   Alcohol use: No   Drug use: No     Allergies   Codeine, Morphine and codeine, Latex, Lisinopril, Lovastatin, and Prednisone   Review of Systems Review of Systems Per HPI  Physical Exam Triage Vital Signs ED Triage Vitals  Encounter Vitals Group     BP 04/23/23 1026 (!) 152/78     Systolic BP Percentile --      Diastolic BP Percentile --      Pulse Rate 04/23/23 1026 82     Resp 04/23/23 1026 16     Temp 04/23/23 1026 98.7 F (37.1 C)     Temp Source 04/23/23 1026 Oral     SpO2 04/23/23 1026 98 %     Weight --      Height --      Head Circumference --      Peak Flow --      Pain Score 04/23/23 1029 5     Pain Loc --      Pain Education --      Exclude from Growth Chart --    No data found.  Updated Vital Signs BP (!) 152/78 (BP Location: Left Arm)   Pulse 82   Temp 98.7 F (37.1 C) (Oral)   Resp 16   LMP 12/29/2012   SpO2 98%   Visual Acuity Right Eye Distance:   Left Eye Distance:   Bilateral Distance:    Right Eye Near:   Left Eye Near:    Bilateral Near:     Physical Exam Constitutional:      General: She is not in acute distress.    Appearance: Normal appearance. She is not toxic-appearing or diaphoretic.  HENT:     Head: Normocephalic and atraumatic.     Right Ear: Ear canal normal. No drainage, swelling or tenderness. A middle ear effusion is present. Tympanic membrane is not perforated, erythematous or bulging.     Left Ear: Ear canal normal. No drainage, swelling or tenderness. A middle ear  effusion is present. Tympanic membrane is not perforated, erythematous or bulging.     Ears:     Comments: No jaw swelling.    Nose: Congestion present.     Mouth/Throat:     Mouth: Mucous membranes are moist.  Pharynx: Posterior oropharyngeal erythema present.  Eyes:     Extraocular Movements: Extraocular movements intact.     Conjunctiva/sclera: Conjunctivae normal.     Pupils: Pupils are equal, round, and reactive to light.  Cardiovascular:     Rate and Rhythm: Normal rate and regular rhythm.     Pulses: Normal pulses.     Heart sounds: Normal heart sounds.  Pulmonary:     Effort: Pulmonary effort is normal. No respiratory distress.     Breath sounds: Normal breath sounds. No wheezing.  Abdominal:     General: Abdomen is flat. Bowel sounds are normal.     Palpations: Abdomen is soft.  Musculoskeletal:        General: Normal range of motion.     Cervical back: Normal range of motion.  Skin:    General: Skin is warm and dry.  Neurological:     General: No focal deficit present.     Mental Status: She is alert and oriented to person, place, and time. Mental status is at baseline.     Cranial Nerves: Cranial nerves 2-12 are intact.     Sensory: Sensation is intact.     Motor: Motor function is intact.     Coordination: Coordination is intact.     Gait: Gait is intact.  Psychiatric:        Mood and Affect: Mood normal.        Behavior: Behavior normal.      UC Treatments / Results  Labs (all labs ordered are listed, but only abnormal results are displayed) Labs Reviewed  CULTURE, GROUP A STREP ([facility])  SARS CORONAVIRUS 2 (TAT 6-24 HRS)  POCT RAPID STREP A (OFFICE)    EKG   Radiology No results found.  Procedures Procedures (including critical care time)  Medications Ordered in UC Medications - No data to display  Initial Impression / Assessment and Plan / UC Course  I have reviewed the triage vital signs and the nursing notes.  Pertinent labs & imaging  results that were available during my care of the patient were reviewed by me and considered in my medical decision making (see chart for details).     Patient presents with symptoms likely from a viral upper respiratory infection. Do not suspect underlying cardiopulmonary process. Symptoms seem unlikely related to ACS, CHF or COPD exacerbations, pneumonia, pneumothorax. Patient is nontoxic appearing and not in need of emergent medical intervention. Rapid strep was negative. Throat culture and covid test pending.  Suspect jaw pain is radiation from ear or throat. No swelling noted on exam.   Recommended symptom control with medications and supportive care, adequate fluids, rest.   Patient reporting headaches since PCP switched her to amlodipine. Patient is neurovascularly intact and BP only mildly elevated today. Therefore, advised patient to follow up with PCP as soon as possible to notify them that she is not tolerating new BP medication well.   Return if symptoms fail to improve in 1-2 weeks or you develop shortness of breath, chest pain, severe headache. Patient states understanding and is agreeable.  Discharged with PCP followup.  Final Clinical Impressions(s) / UC Diagnoses   Final diagnoses:  Viral upper respiratory tract infection with cough  Sore throat     Discharge Instructions      Strep is negative.  Throat culture and COVID test pending.  Suspect you have a viral cause to your symptoms that should run its course.  I have prescribed you medication to help alleviate symptoms.  Flonase will also be helpful.  Follow-up with your primary care doctor about headaches related to your blood pressure medication.     ED Prescriptions     Medication Sig Dispense Auth. Provider   guaiFENesin 200 MG tablet Take 1 tablet (200 mg total) by mouth every 4 (four) hours as needed for cough or to loosen phlegm. 30 tablet [place], Acie Fredrickson, [place]      PDMP not reviewed this encounter.    Gustavus Bryant, [place] 04/23/23 1125

## 2023-04-23 NOTE — Discharge Instructions (Signed)
Strep is negative.  Throat culture and COVID test pending.  Suspect you have a viral cause to your symptoms that should run its course.  I have prescribed you medication to help alleviate symptoms.  Flonase will also be helpful.  Follow-up with your primary care doctor about headaches related to your blood pressure medication.

## 2023-04-24 LAB — SARS CORONAVIRUS 2 (TAT 6-24 HRS): SARS Coronavirus 2: NEGATIVE

## 2023-04-27 LAB — CULTURE, GROUP A STREP (THRC)

## 2023-05-13 DIAGNOSIS — M1711 Unilateral primary osteoarthritis, right knee: Secondary | ICD-10-CM | POA: Insufficient documentation

## 2023-12-27 ENCOUNTER — Other Ambulatory Visit: Payer: Self-pay | Admitting: Obstetrics and Gynecology

## 2024-01-10 ENCOUNTER — Emergency Department (HOSPITAL_BASED_OUTPATIENT_CLINIC_OR_DEPARTMENT_OTHER)

## 2024-01-10 ENCOUNTER — Emergency Department (HOSPITAL_BASED_OUTPATIENT_CLINIC_OR_DEPARTMENT_OTHER)
Admission: EM | Admit: 2024-01-10 | Discharge: 2024-01-10 | Disposition: A | Discharge status: Home / Self Care | Attending: Emergency Medicine | Admitting: Emergency Medicine

## 2024-01-10 ENCOUNTER — Encounter (HOSPITAL_BASED_OUTPATIENT_CLINIC_OR_DEPARTMENT_OTHER): Payer: Self-pay

## 2024-01-10 DIAGNOSIS — I10 Essential (primary) hypertension: Secondary | ICD-10-CM | POA: Insufficient documentation

## 2024-01-10 DIAGNOSIS — J45909 Unspecified asthma, uncomplicated: Secondary | ICD-10-CM | POA: Insufficient documentation

## 2024-01-10 DIAGNOSIS — Z79899 Other long term (current) drug therapy: Secondary | ICD-10-CM | POA: Insufficient documentation

## 2024-01-10 DIAGNOSIS — K219 Gastro-esophageal reflux disease without esophagitis: Secondary | ICD-10-CM | POA: Diagnosis not present

## 2024-01-10 DIAGNOSIS — D259 Leiomyoma of uterus, unspecified: Secondary | ICD-10-CM | POA: Insufficient documentation

## 2024-01-10 DIAGNOSIS — R1084 Generalized abdominal pain: Secondary | ICD-10-CM

## 2024-01-10 DIAGNOSIS — Z91040 Latex allergy status: Secondary | ICD-10-CM | POA: Diagnosis not present

## 2024-01-10 LAB — COMPREHENSIVE METABOLIC PANEL WITH GFR
ALT: 10 U/L (ref 0–44)
AST: 19 U/L (ref 15–41)
Albumin: 4.4 g/dL (ref 3.5–5.0)
Alkaline Phosphatase: 58 U/L (ref 38–126)
Anion gap: 12 (ref 5–15)
BUN: 12 mg/dL (ref 8–23)
CO2: 22 mmol/L (ref 22–32)
Calcium: 9.6 mg/dL (ref 8.9–10.3)
Chloride: 107 mmol/L (ref 98–111)
Creatinine, Ser: 1 mg/dL (ref 0.44–1.00)
GFR, Estimated: >60 mL/min (ref >60)
Glucose, Bld: 103 mg/dL — ABNORMAL HIGH (ref 70–99)
Potassium: 3.9 mmol/L (ref 3.5–5.1)
Sodium: 141 mmol/L (ref 135–145)
Total Bilirubin: 0.7 mg/dL (ref 0.0–1.2)
Total Protein: 7 g/dL (ref 6.5–8.1)

## 2024-01-10 LAB — URINALYSIS, ROUTINE W REFLEX MICROSCOPIC
Bilirubin Urine: NEGATIVE
Glucose, UA: NEGATIVE mg/dL
Ketones, ur: NEGATIVE mg/dL
Leukocytes,Ua: NEGATIVE
Nitrite: NEGATIVE
Protein, ur: NEGATIVE mg/dL
Specific Gravity, Urine: 1.025 (ref 1.005–1.030)
pH: 5.5 (ref 5.0–8.0)

## 2024-01-10 LAB — TROPONIN T, HIGH SENSITIVITY
Troponin T High Sensitivity: <15 ng/L (ref <19)
Troponin T High Sensitivity: <15 ng/L (ref <19)

## 2024-01-10 LAB — CBC WITH DIFFERENTIAL/PLATELET
Abs Immature Granulocytes: 0.03 10*3/uL (ref 0.00–0.07)
Basophils Absolute: 0 10*3/uL (ref 0.0–0.1)
Basophils Relative: 0 %
Eosinophils Absolute: 0.1 10*3/uL (ref 0.0–0.5)
Eosinophils Relative: 1 %
HCT: 40.7 % (ref 36.0–46.0)
Hemoglobin: 13.8 g/dL (ref 12.0–15.0)
Immature Granulocytes: 0 %
Lymphocytes Relative: 27 %
Lymphs Abs: 1.9 10*3/uL (ref 0.7–4.0)
MCH: 30.7 pg (ref 26.0–34.0)
MCHC: 33.9 g/dL (ref 30.0–36.0)
MCV: 90.6 fL (ref 80.0–100.0)
Monocytes Absolute: 0.4 10*3/uL (ref 0.1–1.0)
Monocytes Relative: 6 %
Neutro Abs: 4.6 10*3/uL (ref 1.7–7.7)
Neutrophils Relative %: 66 %
Platelets: 257 10*3/uL (ref 150–400)
RBC: 4.49 MIL/uL (ref 3.87–5.11)
RDW: 12.7 % (ref 11.5–15.5)
WBC: 7 10*3/uL (ref 4.0–10.5)
nRBC: 0 % (ref 0.0–0.2)

## 2024-01-10 LAB — URINALYSIS, MICROSCOPIC (REFLEX)

## 2024-01-10 LAB — LIPASE, BLOOD: Lipase: 28 U/L (ref 11–51)

## 2024-01-10 MED ORDER — PANTOPRAZOLE SODIUM 40 MG IV SOLR
40.0000 mg | Freq: Once | INTRAVENOUS | Status: DC
  Filled 2024-01-10: qty 10

## 2024-01-10 MED ORDER — IOHEXOL 300 MG/ML  SOLN: 100.0000 mL | Freq: Once | INTRAMUSCULAR | Status: DC | PRN

## 2024-01-10 MED ORDER — ALUM & MAG HYDROXIDE-SIMETH 200-200-20 MG/5ML PO SUSP
15.0000 mL | Freq: Once | ORAL | Status: DC
  Filled 2024-01-10: qty 30

## 2024-01-10 NOTE — ED Triage Notes (Signed)
 Pt states that she has some abdominal pain on the right lower side x 2 weeks. . States that she has had some spotting. Reports that she seen her OBGYN and they did an ultrasound that showed some thickening of her urterus.

## 2024-01-10 NOTE — Discharge Instructions (Signed)
 Your workup was reassuring.  No concerning cause of your chest pain.  Likely reflux.  Follow-up with your primary care doctor.  CT scan did not show any concerning findings within the abdomen other than uterine fibroids.  Information for our GYN clinic listed above.  Please call their office to establish a new GYN.  Return for emergent symptoms. Your abdominal pain could be coming from uterine fibroids which is treated with anti-inflammatory medicines.  However you should not take these medicines until you are evaluated by your primary care doctor regarding the acid reflux.  You can take Tylenol  1000 mg every 6 hours in the meantime for pain control.

## 2024-01-10 NOTE — ED Provider Notes (Signed)
 [place] [facility] Provider Note   CSN: [id number] Arrival date & time: 01/10/24  1258     Patient presents with: Abdominal Pain   Brenda Peters is a 68 y.o. female.   68 year old female presents with her daughter for concern of abdominal pain that sort of is generalized has been ongoing for the past couple weeks.  She states this started after she had some vaginal spotting and was evaluated by her GYN and had an ultrasound done.  She was told her endometrial wall was thick and she was Kommer need to have some tissue sampling collected this has not been scheduled yet as she states that she would like to find a new GYN.  She states this pain feels different than anything she had around the time she was spotting.  Also endorses history of acid reflux but does not take her prescribed PPI.  States the reflux symptoms do radiate into her chest.  She states she is not having any more vaginal spotting.  No dysuria.  No nausea or vomiting.  The history is provided by the patient. No language interpreter was used.       Prior to Admission medications   Medication Sig Start Date End Date Taking? Authorizing Provider  acetaminophen  (TYLENOL ) 500 MG tablet Take 1,000 mg by mouth every 6 (six) hours as needed for mild pain.    [provider]  amLODipine  (NORVASC ) 2.5 MG tablet Take by mouth. 05/24/20   [provider]  amoxicillin -clavulanate (AUGMENTIN ) 875-125 MG tablet Take 1 tablet by mouth every 12 (twelve) hours. 12/12/22   Hazen Darryle BRAVO, FNP  azelastine (ASTELIN) 0.1 % nasal spray SMARTSIG:1-2 Spray(s) Both Nares 1-2 Times Daily 05/13/21   [provider]  benzonatate  (TESSALON ) 100 MG capsule Take 1 capsule (100 mg total) by mouth every 8 (eight) hours as needed for cough. 06/14/22   Mound, Haley E, FNP  betamethasone  dipropionate 0.05 % cream Apply 1 application topically 2 (two) times daily. 12/04/20   [provider]  cetirizine  (ZYRTEC ) 10 MG tablet Take 1 tablet (10 mg total) by mouth daily for 10 days. 06/14/22 06/24/22  Hazen Darryle BRAVO, FNP  dicyclomine  (BENTYL ) 20 MG tablet Take 1 tablet (20 mg total) by mouth 2 (two) times daily. 11/03/20   Vonn Hadassah LABOR, PA-C  ergocalciferol (VITAMIN D2) 1.25 MG (50000 UT) capsule Take 1 capsule by mouth once a week. 09/22/13   [provider]  guaiFENesin  200 MG tablet Take 1 tablet (200 mg total) by mouth every 4 (four) hours as needed for cough or to loosen phlegm. 04/23/23   Hazen Darryle BRAVO, FNP  hydrochlorothiazide (HYDRODIURIL) 12.5 MG tablet Take 12.5 mg by mouth every morning. 08/04/20   [provider]  ibuprofen  (ADVIL ) 600 MG tablet Take 1 tablet (600 mg total) by mouth every 6 (six) hours as needed. 11/09/22   Blaise Aleene KIDD, MD  lidocaine  (LIDODERM ) 5 % Place 1 patch onto the skin daily. Remove & Discard patch within 12 hours or as directed by MD 05/31/21   Prosperi, Sherlean H, PA-C  meloxicam  (MOBIC ) 15 MG tablet Take 1 tablet (15 mg total) by mouth daily. 05/14/21   Sikora, Rebecca, DPM  methocarbamol  (ROBAXIN ) 500 MG tablet Take 1 tablet (500 mg total) by mouth 2 (two) times daily. 05/31/21   Prosperi, Christian H, PA-C  nystatin  (MYCOSTATIN /NYSTOP ) powder Apply 1 Application topically 3 (three) times daily. Clean and dry belly button area and  apply powder 3x per day until rash is gone. 06/04/22   Melvenia Motto, MD  omeprazole (PRILOSEC) 20 MG capsule Take 20 mg by mouth daily.    [provider]  ondansetron  (ZOFRAN -ODT) 4 MG disintegrating tablet Take 1 tablet (4 mg total) by mouth every 8 (eight) hours as needed for nausea or vomiting. 06/16/22   Geiple, Joshua, PA-C  traMADol  (ULTRAM ) 50 MG tablet Take 50 mg by mouth every 8 (eight) hours as needed. 09/13/20   [provider]    Allergies: Codeine, Morphine  and codeine, Latex, Lisinopril, Lovastatin, and Prednisone     Review of Systems  Constitutional:  Negative for  chills and fever.  Respiratory:  Negative for shortness of breath.   Cardiovascular:  Positive for chest pain. Negative for palpitations and leg swelling.  Gastrointestinal:  Positive for abdominal pain. Negative for nausea and vomiting.  Genitourinary:  Negative for dysuria and flank pain.  Neurological:  Negative for light-headedness.  All other systems reviewed and are negative.   Updated Vital Signs BP (!) 156/93 (BP Location: Left Arm)   Pulse 87   Temp 97.8 F (36.6 C)   Resp 18   Ht 5' (1.524 m)   Wt 77.1 kg   LMP 12/29/2012   SpO2 98%   BMI 33.20 kg/m   Physical Exam Vitals and nursing note reviewed.  Constitutional:      General: She is not in acute distress.    Appearance: Normal appearance. She is not ill-appearing.  HENT:     Head: Normocephalic and atraumatic.     Nose: Nose normal.   Eyes:     Conjunctiva/sclera: Conjunctivae normal.    Cardiovascular:     Rate and Rhythm: Normal rate and regular rhythm.  Pulmonary:     Effort: Pulmonary effort is normal. No respiratory distress.  Abdominal:     General: There is no distension.     Tenderness: There is abdominal tenderness. There is no right CVA tenderness, left CVA tenderness, guarding or rebound.   Musculoskeletal:        General: No deformity. Normal range of motion.     Cervical back: Normal range of motion.   Skin:    Findings: No rash.   Neurological:     Mental Status: She is alert.     (all labs ordered are listed, but only abnormal results are displayed) Labs Reviewed  COMPREHENSIVE METABOLIC PANEL WITH GFR - Abnormal; Notable for the following components:      Result Value   Glucose, Bld 103 (*)    All other components within normal limits  URINALYSIS, ROUTINE W REFLEX MICROSCOPIC - Abnormal; Notable for the following components:   APPearance HAZY (*)    Hgb urine dipstick TRACE (*)    All other components within normal limits  URINALYSIS, MICROSCOPIC (REFLEX) - Abnormal;  Notable for the following components:   Bacteria, UA RARE (*)    All other components within normal limits  LIPASE, BLOOD  CBC WITH DIFFERENTIAL/PLATELET  TROPONIN T, HIGH SENSITIVITY    EKG: None  Radiology: No results found.   Procedures   Medications Ordered in the ED  alum & mag hydroxide-simeth (MAALOX/MYLANTA) 200-200-20 MG/5ML suspension 15 mL (has no administration in time range)  pantoprazole  (PROTONIX ) injection 40 mg (has no administration in time range)    Clinical Course as of 01/10/24 1729  Sun Jan 10, 2024  1515 Patient reported contrast allergy.  She states this was discovered in 2012 however I see multiple  CTs done with contrast since then but patient does not want to have any contrast.  Will switch this to without contrast. Your exam is overall reassuring.  No cerumen impaction or inflammation.  Potential fluid behind TM.  Discussed sinus rinse. Patient also declines Protonix  and Maalox.  She states she would like to discuss with her PCP prior to taking these medicines. [AA]  1727 CT with evidence of uterine fibroids otherwise no acute finding.  Troponin negative.  CT scan without acute intracranial finding.  Chest x-ray without acute cardiopulmonary process. [AA]    Clinical Course User Index [AA] Hildegard Loge, PA-C                                 Medical Decision Making Amount and/or Complexity of Data Reviewed Labs: ordered. Radiology: ordered.  Risk OTC drugs. Prescription drug management.   Medical Decision Making / ED Course   This patient presents to the ED for concern of abdominal pain, chest pain, this involves an extensive number of treatment options, and is a complaint that carries with it a high risk of complications and morbidity.  The differential diagnosis includes ACS, PE, pneumonia, GERD, gastritis, uterine fibroids  MDM: 68 year old female presents today for concern of abdominal pain. Hemodynamically stable. Well-appearing. Will  order lab work, CT scan, provide symptom control.  See clinical course for ED course and reevaluations.  Ultimately reassuring workup.  Patient is appropriate for discharge.  She will follow-up with GYN as well as PCP. Does not want to start medicines until she sees her PCP.   Additional history obtained: -Additional history obtained from daughter at bedside -External records from outside source obtained and reviewed including: Chart review including previous notes, labs, imaging, consultation notes   Lab Tests: -I ordered, reviewed, and interpreted labs.   The pertinent results include:   Labs Reviewed  COMPREHENSIVE METABOLIC PANEL WITH GFR - Abnormal; Notable for the following components:      Result Value   Glucose, Bld 103 (*)    All other components within normal limits  URINALYSIS, ROUTINE W REFLEX MICROSCOPIC - Abnormal; Notable for the following components:   APPearance HAZY (*)    Hgb urine dipstick TRACE (*)    All other components within normal limits  URINALYSIS, MICROSCOPIC (REFLEX) - Abnormal; Notable for the following components:   Bacteria, UA RARE (*)    All other components within normal limits  LIPASE, BLOOD  CBC WITH DIFFERENTIAL/PLATELET  TROPONIN T, HIGH SENSITIVITY      EKG  EKG Interpretation Date/Time:    Ventricular Rate:    PR Interval:    QRS Duration:    QT Interval:    QTC Calculation:   R Axis:      Text Interpretation:           Imaging Studies ordered: I ordered imaging studies including CT abdomen pelvis without contrast, chest x-ray I independently visualized and interpreted imaging. I agree with the radiologist interpretation   Medicines ordered and prescription drug management: Meds ordered this encounter  Medications   alum & mag hydroxide-simeth (MAALOX/MYLANTA) 200-200-20 MG/5ML suspension 15 mL   pantoprazole  (PROTONIX ) injection 40 mg    -I have reviewed the patients home medicines and have made adjustments as  needed   Reevaluation: After the interventions noted above, I reevaluated the patient and found that they have :stayed the same  Co morbidities that complicate the patient evaluation  Past Medical History:  Diagnosis Date   Asthma    Carpal tunnel syndrome    Heart murmur    Heart palpitations    High cholesterol    Hypertension       Dispostion: Discharged in stable condition.  Return precaution discussed.  Patient voices understanding and is in agreement with the plan.    Final diagnoses:  Generalized abdominal pain  Gastroesophageal reflux disease, unspecified whether esophagitis present  Uterine leiomyoma, unspecified location    ED Discharge Orders     None          Hildegard Loge, PA-C 01/10/24 1733    Elnor Jayson LABOR, DO 01/18/24 1530

## 2024-02-22 ENCOUNTER — Ambulatory Visit
Admission: EM | Admit: 2024-02-22 | Discharge: 2024-02-22 | Disposition: A | Discharge status: Home / Self Care | Attending: Emergency Medicine | Admitting: Emergency Medicine

## 2024-02-22 ENCOUNTER — Encounter: Payer: Self-pay | Admitting: Emergency Medicine

## 2024-02-22 DIAGNOSIS — M109 Gout, unspecified: Secondary | ICD-10-CM | POA: Diagnosis not present

## 2024-02-22 HISTORY — DX: Benign neoplasm of connective and other soft tissue, unspecified: D21.9

## 2024-02-22 MED ORDER — COLCHICINE 0.6 MG PO TABS
ORAL_TABLET | ORAL | 0 refills | Status: AC
Start: 2024-02-22 — End: ?

## 2024-02-22 MED ORDER — DICLOFENAC SODIUM 1 % EX GEL
2.0000 g | Freq: Four times a day (QID) | CUTANEOUS | 0 refills | Status: AC
Start: 2024-02-22 — End: ?

## 2024-02-22 NOTE — ED Triage Notes (Signed)
 Pt reports swelling and pain to R ankle and foot that started yesterday morning. Denies any injury to area. Notes she has a hx of gout and has recently drank some sugary drinks. States this feels much worse than my last two bouts of gout.  It has been ~1-23yrs since last gout episode. Area is warm to touch. The pain and swelling is worse around the ankle but spreads down the top of the foot into the 2nd and 3rd toes. Pain is making it harder to walk. Pt is taking tylenol  with no relief.

## 2024-02-22 NOTE — ED Triage Notes (Signed)
 Patient states she almost fell in the room while waiting. Offered assistance and or wheelchair no, if I am going to fall it's going to be on ya'll.

## 2024-02-22 NOTE — Discharge Instructions (Signed)
 This appears to be gout.  Colchicine  as directed, Tylenol  1000 mg 3-4 times a day as needed for pain, Voltaren  topical gel up to 4 times a day for pain and swelling.

## 2024-02-22 NOTE — ED Provider Notes (Signed)
 HPI  SUBJECTIVE:  Brenda Peters is a 68 y.o. female who presents with right ankle and foot pain swelling starting yesterday.  She describes the pain as constant, throbbing, sharp, aching and burning.  She reports hypersensitivity, erythema and limitation of motion of the ankle.  No fevers, trauma to the ankle or foot, change in physical activity.  She tried Tylenol  1000 mg once without improvement in her symptoms.  Symptoms are worse with walking.  She has a past medical history of gout in her right ankle and states this feels similar to that.  She also has a history of asthma, hypertension.  No history of chronic kidney disease, diabetes.  PCP: [facility].  Orthopedics: [facility].    Past Medical History:  Diagnosis Date   Asthma    Carpal tunnel syndrome    Fibroids    Heart murmur    Heart palpitations    High cholesterol    Hypertension     Past Surgical History:  Procedure Laterality Date   TUBAL LIGATION      Family History  Problem Relation Age of Onset   Diabetes Mother    Hypertension Mother     Social History   Tobacco Use   Smoking status: Never   Smokeless tobacco: Never  Vaping Use   Vaping status: Never Used  Substance Use Topics   Alcohol use: No   Drug use: No    No current facility-administered medications for this encounter.  Current Outpatient Medications:    acetaminophen  (TYLENOL ) 500 MG tablet, Take 1,000 mg by mouth every 6 (six) hours as needed for mild pain., Disp: , Rfl:    amLODipine  (NORVASC ) 2.5 MG tablet, Take by mouth., Disp: , Rfl:    amoxicillin  (AMOXIL ) 500 MG tablet, , Disp: , Rfl:    colchicine  0.6 MG tablet, 2 tabs po x 1, then one tab po 1 hour later.  1 tablet 1-2 times a day for 4 more days or until flare resolves., Disp: 15 tablet, Rfl: 0   diclofenac  Sodium (VOLTAREN ) 1 % GEL, Apply 2 g topically 4 (four) times daily., Disp: 100 g, Rfl: 0   ergocalciferol (VITAMIN D2) 1.25 MG (50000 UT) capsule, Take 1 capsule by  mouth once a week. (Patient not taking: Reported on 02/22/2024), Disp: , Rfl:    guaiFENesin  200 MG tablet, Take 1 tablet (200 mg total) by mouth every 4 (four) hours as needed for cough or to loosen phlegm. (Patient not taking: Reported on 02/22/2024), Disp: 30 tablet, Rfl: 0  Allergies  Allergen Reactions   Codeine Other (See Comments), Palpitations and Shortness Of Breath    Cardiac arrest////shortness of breath  Other Reaction(s): Not available  codeine   Iodinated Contrast Media Shortness Of Breath    Pt reports difficulty breathing when she receives contrast.   Morphine  Shortness Of Breath   Morphine  And Codeine Shortness Of Breath   Latex Itching, Rash and Dermatitis   Lisinopril Cough   Lovastatin Other (See Comments)    Cramps  Other Reaction(s): Not available  lovastatin   Prednisone  Other (See Comments) and Palpitations    States her BP elevated, she felt like her throat was closing  Other Reaction(s): tachycardia  prednisone      ROS  As noted in HPI.   Physical Exam  BP 123/83 (BP Location: Left Arm)   Pulse 85   Temp 98.1 F (36.7 C) (Oral)   Resp 14   Wt 77.1 kg   LMP 12/29/2012  SpO2 96%   BMI 33.18 kg/m   Constitutional: Well developed, well nourished, no acute distress Eyes:  EOMI, conjunctiva normal bilaterally HENT: Normocephalic, atraumatic,mucus membranes moist Respiratory: Normal inspiratory effort Cardiovascular: Normal rate GI: nondistended skin: Erythema over foot. Musculoskeletal:  R Ankle: Diffuse erythema, swelling over ankle and foot.  Foot hypersensitive to touch.  Proximal fibula NT , Distal fibula tender, Medial malleolus tender,  Deltoid ligaments medially tender,  ATFL tender, calcaneofibular ligament tender, posterior tablofibular ligament tender  ,  Achilles NT, calcaneus T,  Proximal 5th metatarsal NT, Midfoot NT, distal NVI with baseline sensation / motor to foot with DP 2+.  pain with dorsiflexion/plantar flexion. Pain   with inversion/eversion.  - bruising. Pt able to bear weight in dept.        Neurologic: Alert & oriented x 3, no focal neuro deficits Psychiatric: Speech and behavior appropriate   ED Course   Medications - No data to display  No orders of the defined types were placed in this encounter.   No results found for this or any previous visit (from the past 24 hours). No results found.  ED Clinical Impression  1. Acute gout of right ankle, unspecified cause      ED Assessment/Plan    Reviewed previous records.  She was thought to have gout in 2017 with an elevated uric acid, CRP, sed rate.  Calculated creatinine clearance based on labs done from 01/10/2024 68 milliliters per minute  Patient consistent with gout.  In the differential is septic joint, but I feel that this is much less likely.  Patient states that she cannot take prednisone  due to palpitations and was told not to take oral NSAIDs due to the blood pressure medication she is on.  Discussed with her that her only option in this case is going to be colchicine  and topical Voltaren , Tylenol .  She declined a shot of Toradol  here.  She denies trauma, deferring imaging.  Doubt fracture.  Will send home with colchicine , no renal dosage adjustment necessary per up-to-date.  Tylenol  1000 mg 3-4 times a day/topical Voltaren .  Follow-up with orthopedics.  ER return precautions given.  Discussed MDM, treatment plan, and plan for follow-up with patient. Discussed sn/sx that should prompt return to the ED. patient agrees with plan.   Meds ordered this encounter  Medications   diclofenac  Sodium (VOLTAREN ) 1 % GEL    Sig: Apply 2 g topically 4 (four) times daily.    Dispense:  100 g    Refill:  0   colchicine  0.6 MG tablet    Sig: 2 tabs po x 1, then one tab po 1 hour later.  1 tablet 1-2 times a day for 4 more days or until flare resolves.    Dispense:  15 tablet    Refill:  0      *This clinic note was created using  Scientist, clinical (histocompatibility and immunogenetics). Therefore, there may be occasional mistakes despite careful proofreading.  ?    Van Knee, MD 02/25/24 1451

## 2024-02-25 ENCOUNTER — Telehealth: Payer: Self-pay | Admitting: *Deleted

## 2024-02-25 NOTE — Telephone Encounter (Signed)
 I spoke with Brenda Peters (confirmed identity with 2 identifiers). She states she has had severe diarrhea since starting colchicine  prescribed at her visit on 8/11 and has stopped taking it. Advised her to either reach out to her PCP or return to Urgent Care for evaluation. She verbalized understanding.

## 2024-05-03 LAB — AMB RESULTS CONSOLE CBG: Glucose: 159

## 2024-05-03 LAB — HEMOGLOBIN A1C: Hemoglobin A1C: 6.5

## 2024-05-03 NOTE — Progress Notes (Signed)
 Pt screened for HTN, blood sugar and A1C. SDOH denied. A1C elevated indicating DM. Pt was educated on healthy lifestyles and diet changes. Educational material handed to patient. Encouraged her to follow up with PCP or [facility]

## 2024-06-21 NOTE — Progress Notes (Signed)
 Pt attended 05/03/2024 screening event with BP of 129/83.  Per initial f/u pt was reached out via phone and stated that she is having some financial strain & recently retired and working out things with her Medicare and has been unable to see PCP. Pt stated that she did not need assistance and was just waiting till things work out with finances, CHW encouraged to continue being seen at [facility] until she is able to get back with PCP.  Per chart review pt does have a PCP Brenda Peters; [facility]), insurance, and is not a smoker. Pt does not indicate any SDOH needs at this time.  No additional pt f/u to be scheduled at this time per health equity protocol.

## 2024-08-05 NOTE — Progress Notes (Signed)
 Erroneous encounter
